# Patient Record
Sex: Female | Born: 1970 | Race: Black or African American | Hispanic: No | Marital: Married | Smoking: Never smoker
Health system: Southern US, Community
[De-identification: ages and names within clinical notes are randomized; demographics above are authoritative.]

## PROBLEM LIST (undated history)

## (undated) DIAGNOSIS — Z8249 Family history of ischemic heart disease and other diseases of the circulatory system: Secondary | ICD-10-CM

## (undated) DIAGNOSIS — K219 Gastro-esophageal reflux disease without esophagitis: Secondary | ICD-10-CM

## (undated) DIAGNOSIS — I499 Cardiac arrhythmia, unspecified: Secondary | ICD-10-CM

## (undated) DIAGNOSIS — N90810 Female genital mutilation status, unspecified: Secondary | ICD-10-CM

## (undated) HISTORY — DX: Family history of ischemic heart disease and other diseases of the circulatory system: Z82.49

## (undated) HISTORY — PX: OTHER SURGICAL HISTORY: SHX169

## (undated) HISTORY — DX: Cardiac arrhythmia, unspecified: I49.9

---

## 2002-09-12 HISTORY — PX: TONSILLECTOMY: SUR1361

## 2010-09-12 NOTE — L&D Delivery Note (Signed)
Delivery Note Pt rapidly progressed to C/C/+2 with pressure, had received Stadol x 3 doses.  Pushed x 2-3 ctx to deliver.  Narcan available in room.  At 2:11 PM a viable female was delivered via Vaginal, Spontaneous Delivery (Presentation: Left Occiput Anterior).  APGAR: 9, 9; weight 7 lb 2.5 oz (3246 g).   Placenta status: Intact, Spontaneous.  Cord: 3 vessels with the following complications: None.    Anesthesia:  IV pain meds Episiotomy: None Lacerations: 2nd degree Suture Repair: 2.0 vicryl to reinforce sphincter and 3-0 vicryl rapide for 2nd degree Est. Blood Loss (mL): 300  Mom to postpartum.  Baby to nursery-stable.  Br/Bo, AB +  [name],Kirsten Torres 05/28/2011, 2:49 PM

## 2010-10-22 ENCOUNTER — Other Ambulatory Visit: Payer: Self-pay | Admitting: Obstetrics and Gynecology

## 2010-10-22 ENCOUNTER — Inpatient Hospital Stay (HOSPITAL_COMMUNITY)
Admission: AD | Admit: 2010-10-22 | Discharge: 2010-10-23 | Disposition: A | Discharge status: Home / Self Care | Payer: Self-pay | Source: Ambulatory Visit | Attending: Obstetrics and Gynecology | Admitting: Obstetrics and Gynecology

## 2010-10-22 ENCOUNTER — Inpatient Hospital Stay (HOSPITAL_COMMUNITY): Payer: Self-pay

## 2010-10-22 ENCOUNTER — Encounter (HOSPITAL_COMMUNITY): Payer: Self-pay

## 2010-10-22 DIAGNOSIS — O210 Mild hyperemesis gravidarum: Secondary | ICD-10-CM | POA: Insufficient documentation

## 2010-10-22 DIAGNOSIS — B373 Candidiasis of vulva and vagina: Secondary | ICD-10-CM

## 2010-10-22 DIAGNOSIS — O2390 Unspecified genitourinary tract infection in pregnancy, unspecified trimester: Secondary | ICD-10-CM

## 2010-10-22 DIAGNOSIS — B3731 Acute candidiasis of vulva and vagina: Secondary | ICD-10-CM | POA: Insufficient documentation

## 2010-10-22 LAB — ABO/RH: RH Type: POSITIVE

## 2010-10-22 LAB — HIV ANTIBODY (ROUTINE TESTING W REFLEX): HIV: NONREACTIVE

## 2010-10-22 LAB — RPR: RPR: NONREACTIVE

## 2010-10-22 LAB — POCT PREGNANCY, URINE: Preg Test, Ur: POSITIVE

## 2010-11-10 LAB — URINALYSIS, ROUTINE W REFLEX MICROSCOPIC
Hgb urine dipstick: NEGATIVE
Specific Gravity, Urine: >1.03 — ABNORMAL HIGH (ref 1.005–1.030)
Urobilinogen, UA: 0.2 mg/dL (ref 0.0–1.0)
pH: 5.5 (ref 5.0–8.0)

## 2010-11-10 LAB — GC/CHLAMYDIA PROBE AMP, GENITAL
Chlamydia, DNA Probe: NEGATIVE
GC Probe Amp, Genital: NEGATIVE

## 2010-11-10 LAB — CBC
HCT: 36.3 % (ref 36.0–46.0)
Hemoglobin: 11.9 g/dL — ABNORMAL LOW (ref 12.0–15.0)
MCV: 89 fL (ref 78.0–100.0)
Platelets: 180 10*3/uL (ref 150–400)
RBC: 4.08 MIL/uL (ref 3.87–5.11)
WBC: 7.5 10*3/uL (ref 4.0–10.5)

## 2010-11-10 LAB — WET PREP, GENITAL

## 2011-05-04 LAB — STREP B DNA PROBE: GBS: NEGATIVE

## 2011-05-28 ENCOUNTER — Encounter (HOSPITAL_COMMUNITY): Payer: Self-pay | Admitting: *Deleted

## 2011-05-28 ENCOUNTER — Inpatient Hospital Stay (HOSPITAL_COMMUNITY)
Admission: AD | Admit: 2011-05-28 | Discharge: 2011-05-30 | DRG: 775 | Disposition: A | Discharge status: Home / Self Care | Payer: 59 | Source: Ambulatory Visit | Attending: Obstetrics and Gynecology | Admitting: Obstetrics and Gynecology

## 2011-05-28 DIAGNOSIS — Z3490 Encounter for supervision of normal pregnancy, unspecified, unspecified trimester: Secondary | ICD-10-CM

## 2011-05-28 DIAGNOSIS — O09529 Supervision of elderly multigravida, unspecified trimester: Secondary | ICD-10-CM | POA: Diagnosis present

## 2011-05-28 HISTORY — DX: Gastro-esophageal reflux disease without esophagitis: K21.9

## 2011-05-28 HISTORY — DX: Female genital mutilation status, unspecified: N90.810

## 2011-05-28 LAB — CBC
MCV: 80.5 fL (ref 78.0–100.0)
Platelets: 224 10*3/uL (ref 150–400)
RDW: 14.7 % (ref 11.5–15.5)
WBC: 8.4 10*3/uL (ref 4.0–10.5)

## 2011-05-28 MED ORDER — SODIUM CHLORIDE 0.9 % IJ SOLN: 3.0000 mL | INTRAMUSCULAR | Status: DC | PRN

## 2011-05-28 MED ORDER — FLEET ENEMA 7-19 GM/118ML RE ENEM: 1.0000 | ENEMA | RECTAL | Status: DC | PRN

## 2011-05-28 MED ORDER — SIMETHICONE 80 MG PO CHEW: 80.0000 mg | CHEWABLE_TABLET | ORAL | Status: DC | PRN

## 2011-05-28 MED ORDER — OXYTOCIN 20 UNITS IN LACTATED RINGERS INFUSION - SIMPLE: 125.0000 mL/h | INTRAVENOUS | Status: DC | PRN

## 2011-05-28 MED ORDER — CALCIUM CARBONATE ANTACID 500 MG PO CHEW
1.0000 | CHEWABLE_TABLET | Freq: Every day | ORAL | Status: DC
  Administered 2011-05-29: 200 mg via ORAL
  Filled 2011-05-28: qty 1

## 2011-05-28 MED ORDER — NALOXONE HCL 0.4 MG/ML IJ SOLN
INTRAMUSCULAR | Status: AC
  Filled 2011-05-28: qty 1

## 2011-05-28 MED ORDER — OXYTOCIN BOLUS FROM INFUSION
500.0000 mL | Freq: Once | INTRAVENOUS | Status: DC
  Filled 2011-05-28: qty 500

## 2011-05-28 MED ORDER — TETANUS-DIPHTH-ACELL PERTUSSIS 5-2.5-18.5 LF-MCG/0.5 IM SUSP
0.5000 mL | Freq: Once | INTRAMUSCULAR | Status: AC
  Administered 2011-05-29: 0.5 mL via INTRAMUSCULAR
  Filled 2011-05-28: qty 0.5

## 2011-05-28 MED ORDER — ONDANSETRON HCL 4 MG PO TABS: 4.0000 mg | ORAL_TABLET | ORAL | Status: DC | PRN

## 2011-05-28 MED ORDER — BUTORPHANOL TARTRATE 2 MG/ML IJ SOLN: 2.0000 mg | Freq: Once | INTRAMUSCULAR | Status: DC

## 2011-05-28 MED ORDER — BUTORPHANOL TARTRATE 2 MG/ML IJ SOLN
1.0000 mg | INTRAMUSCULAR | Status: DC | PRN
  Administered 2011-05-28 (×2): 1 mg via INTRAVENOUS
  Filled 2011-05-28 (×3): qty 1

## 2011-05-28 MED ORDER — DIBUCAINE 1 % RE OINT: 1.0000 "application " | TOPICAL_OINTMENT | RECTAL | Status: DC | PRN

## 2011-05-28 MED ORDER — IBUPROFEN 600 MG PO TABS
600.0000 mg | ORAL_TABLET | Freq: Four times a day (QID) | ORAL | Status: DC | PRN
  Administered 2011-05-28: 600 mg via ORAL
  Filled 2011-05-28: qty 1

## 2011-05-28 MED ORDER — WITCH HAZEL-GLYCERIN EX PADS
1.0000 "application " | MEDICATED_PAD | CUTANEOUS | Status: DC | PRN
  Administered 2011-05-28: 1 via TOPICAL

## 2011-05-28 MED ORDER — TERBUTALINE SULFATE 1 MG/ML IJ SOLN: 0.2500 mg | Freq: Once | INTRAMUSCULAR | Status: DC | PRN

## 2011-05-28 MED ORDER — CITRIC ACID-SODIUM CITRATE 334-500 MG/5ML PO SOLN: 30.0000 mL | ORAL | Status: DC | PRN

## 2011-05-28 MED ORDER — ACETAMINOPHEN 325 MG PO TABS: 650.0000 mg | ORAL_TABLET | ORAL | Status: DC | PRN

## 2011-05-28 MED ORDER — ONDANSETRON HCL 4 MG/2ML IJ SOLN: 4.0000 mg | INTRAMUSCULAR | Status: DC | PRN

## 2011-05-28 MED ORDER — LACTATED RINGERS IV SOLN: 500.0000 mL | INTRAVENOUS | Status: DC | PRN

## 2011-05-28 MED ORDER — ONDANSETRON HCL 4 MG/2ML IJ SOLN: 4.0000 mg | Freq: Four times a day (QID) | INTRAMUSCULAR | Status: DC | PRN

## 2011-05-28 MED ORDER — PRENATAL PLUS 27-1 MG PO TABS
1.0000 | ORAL_TABLET | Freq: Every day | ORAL | Status: DC
  Administered 2011-05-29 – 2011-05-30 (×2): 1 via ORAL
  Filled 2011-05-28 (×2): qty 1

## 2011-05-28 MED ORDER — BENZOCAINE-MENTHOL 20-0.5 % EX AERO
1.0000 "application " | INHALATION_SPRAY | CUTANEOUS | Status: DC | PRN
  Administered 2011-05-28: 1 via TOPICAL

## 2011-05-28 MED ORDER — LIDOCAINE HCL (PF) 1 % IJ SOLN
30.0000 mL | INTRAMUSCULAR | Status: AC | PRN
  Administered 2011-05-28: 30 mL via SUBCUTANEOUS
  Filled 2011-05-28: qty 30

## 2011-05-28 MED ORDER — SODIUM CHLORIDE 0.9 % IJ SOLN: 3.0000 mL | Freq: Two times a day (BID) | INTRAMUSCULAR | Status: DC

## 2011-05-28 MED ORDER — OXYTOCIN 20 UNITS IN LACTATED RINGERS INFUSION - SIMPLE
125.0000 mL/h | Freq: Once | INTRAVENOUS | Status: AC
  Administered 2011-05-28: 125 mL/h via INTRAVENOUS
  Filled 2011-05-28: qty 1000

## 2011-05-28 MED ORDER — ZOLPIDEM TARTRATE 5 MG PO TABS: 5.0000 mg | ORAL_TABLET | Freq: Every evening | ORAL | Status: DC | PRN

## 2011-05-28 MED ORDER — BUTORPHANOL TARTRATE 2 MG/ML IJ SOLN
2.0000 mg | Freq: Once | INTRAMUSCULAR | Status: AC
  Administered 2011-05-28: 2 mg via INTRAVENOUS

## 2011-05-28 MED ORDER — LACTATED RINGERS IV SOLN
INTRAVENOUS | Status: DC
  Administered 2011-05-28: 09:00:00 via INTRAVENOUS

## 2011-05-28 MED ORDER — LANOLIN HYDROUS EX OINT: TOPICAL_OINTMENT | CUTANEOUS | Status: DC | PRN

## 2011-05-28 MED ORDER — DIPHENHYDRAMINE HCL 25 MG PO CAPS: 25.0000 mg | ORAL_CAPSULE | Freq: Four times a day (QID) | ORAL | Status: DC | PRN

## 2011-05-28 MED ORDER — OXYCODONE-ACETAMINOPHEN 5-325 MG PO TABS
1.0000 | ORAL_TABLET | ORAL | Status: DC | PRN
  Administered 2011-05-29: 1 via ORAL
  Filled 2011-05-28: qty 1

## 2011-05-28 MED ORDER — OXYTOCIN 20 UNITS IN LACTATED RINGERS INFUSION - SIMPLE
1.0000 m[IU]/min | INTRAVENOUS | Status: DC
  Administered 2011-05-28: 2 m[IU]/min via INTRAVENOUS

## 2011-05-28 MED ORDER — IBUPROFEN 600 MG PO TABS
600.0000 mg | ORAL_TABLET | Freq: Four times a day (QID) | ORAL | Status: DC
  Administered 2011-05-28 – 2011-05-30 (×7): 600 mg via ORAL
  Filled 2011-05-28 (×7): qty 1

## 2011-05-28 MED ORDER — SODIUM CHLORIDE 0.9 % IV SOLN: 250.0000 mL | INTRAVENOUS | Status: DC

## 2011-05-28 MED ORDER — OXYCODONE-ACETAMINOPHEN 5-325 MG PO TABS: 2.0000 | ORAL_TABLET | ORAL | Status: DC | PRN

## 2011-05-28 MED ORDER — BENZOCAINE-MENTHOL 20-0.5 % EX AERO
INHALATION_SPRAY | CUTANEOUS | Status: AC
  Administered 2011-05-28: 1 via TOPICAL
  Filled 2011-05-28: qty 56

## 2011-05-28 MED ORDER — LACTATED RINGERS IV SOLN: INTRAVENOUS | Status: DC

## 2011-05-28 MED ORDER — SENNOSIDES-DOCUSATE SODIUM 8.6-50 MG PO TABS
2.0000 | ORAL_TABLET | Freq: Every day | ORAL | Status: DC
  Administered 2011-05-28: 2 via ORAL

## 2011-05-28 NOTE — Progress Notes (Signed)
  05/28/11  FHTs - 130's, accelerations toco Q 3- 6 min AROM for clear fluid, w/o diff/comp SVE 6/90/0  Jeanella Cara, MD

## 2011-05-28 NOTE — H&P (Signed)
Kirsten Torres is a 40 y.o. female G2P1001 at 39+ presenting for onset of labor.  Contractions since about 3am, increasing in intensity and frwuency.  Uncomplicated PNC, limited by later entry 23wk as well as language barrier.  She has h/o femal genital alteration. Maternal Medical History:  Reason for admission: Reason for admission: contractions.  Contractions: Onset was 6-12 hours ago.    Fetal activity: Perceived fetal activity is normal.      OB History    Grav Para Term Preterm Abortions TAB SAB Ect Mult Living   2 1 1  0 0 0 0 0 0 1    G1 SVD 8#, female SVD; G2 present;   Past Medical History  Diagnosis Date  . No pertinent past medical history   GERD  Past Surgical History  Procedure Date  . 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 She does not have any smokeless tobacco history on file. She reports that she does not drink alcohol or use illicit drugs.; married homemaker Meds PNV All NKDA  Review of Systems  Constitutional: Negative.   HENT: Negative.   Eyes: Negative.   Respiratory: Negative.   Cardiovascular: Negative.   Gastrointestinal: Negative.   Genitourinary: Negative.   Musculoskeletal: Negative.   Skin: Negative.   Psychiatric/Behavioral: Negative.     Dilation: 4.5 Effacement (%): 80 Station: 0 Exam by:: K.Wilson,RN Blood pressure 115/64, pulse 75, temperature 98.5 F (36.9 C), temperature source Oral, resp. rate 20, height 5' 4.57" (1.64 m), weight 86.183 kg (190 lb), last menstrual period 08/24/2010. Exam Physical Exam  Constitutional: She is oriented to person, place, and time. She appears well-developed and well-nourished.  HENT:  Head: Normocephalic and atraumatic.  Neck: Normal range of motion. Neck supple. No thyromegaly present.  Cardiovascular: Normal rate and regular rhythm.   Respiratory: Effort normal and  breath sounds normal.  GI: Soft. Bowel sounds are normal.  Musculoskeletal: Normal range of motion.  Neurological: She is alert and oriented to person, place, and time.  Psychiatric: She has a normal mood and affect.    Prenatal labs: ABO, Rh: --/--/AB POS, AB POS (02/10 2120) Antibody:  screen neg Rubella:  immune RPR: Nonreactive (02/10 0000)  HBsAg: Negative (02/10 0000)  HIV: Non-reactive (02/10 0000)  GBS:   neg Pap WNL, UrCx neg, Plt 221 K, Sickle WNL, GC neg, Chl neg, CF neg, glucola 139, 3hr GTT neg  8wk [place] dates preg [place] at 19wk, cwd, ant wall fibroid, post plac, nl anat, female  Assessment/Plan: 39yo G2P1001 at 39+ with onset of labor IV pain meds, epidural PRN AROM and pitocin to augment prn Expect SVD   [name],Ellaina Schuler 05/28/2011, 10:51 AM

## 2011-05-28 NOTE — Progress Notes (Signed)
Contraction since 0300 this morning, G2P1 term, patient very uncomfortable taken directly to room 6 in MAU

## 2011-05-29 LAB — CBC
MCHC: 31.4 g/dL (ref 30.0–36.0)
RDW: 14.7 % (ref 11.5–15.5)

## 2011-05-29 NOTE — Progress Notes (Signed)
Post Partum Day 1 Subjective: no complaints and nl lochia, pain controlled  Objective: Blood pressure 111/76, pulse 81, temperature 98.2 F (36.8 C), temperature source Oral, resp. rate 18, height 5' 4.57" (1.64 m), weight 86.183 kg (190 lb), last menstrual period 08/24/2010, unknown if currently breastfeeding.  Physical Exam:  General: alert and no distress Lochia: appropriate Uterine Fundus: firm    Basename 05/29/11 0601 05/28/11 0925  HGB 9.0* 10.9*  HCT 28.7* 35.1*    Assessment/Plan: Plan for discharge tomorrow   LOS: 1 day   [name],Dilia Alemany 05/29/2011, 9:47 AM

## 2011-05-30 MED ORDER — PRENATAL PLUS 27-1 MG PO TABS: 1.0000 | ORAL_TABLET | Freq: Every day | ORAL | Status: DC

## 2011-05-30 MED ORDER — OXYCODONE-ACETAMINOPHEN 5-325 MG PO TABS: 1.0000 | ORAL_TABLET | ORAL | Status: AC | PRN

## 2011-05-30 MED ORDER — IBUPROFEN 800 MG PO TABS: 800.0000 mg | ORAL_TABLET | Freq: Three times a day (TID) | ORAL | Status: AC | PRN

## 2011-05-30 NOTE — Progress Notes (Signed)
Spoke to patient with help of [employer] [phone].  Asked about medications, pain, bleeding, and breastfeeding.  Pt reported that breastfeeding is going well and that she has no concerns about her physical recovery.  Explained discharge procedures.  Pt's husband gone at time of call, but will be back shortly with carseat for baby.

## 2011-05-30 NOTE — Discharge Summary (Signed)
Obstetric Discharge Summary Reason for Admission: onset of labor Prenatal Procedures: none Intrapartum Procedures: spontaneous vaginal delivery Postpartum Procedures: none Complications-Operative and Postpartum: 2nd degree perineal laceration Hemoglobin  Date Value Range Status  05/29/2011 9.0* 12.0-15.0 (g/dL) Final     DELTA CHECK NOTED     REPEATED TO VERIFY     HCT  Date Value Range Status  05/29/2011 28.7* 36.0-46.0 (%) Final    Discharge Diagnoses: Term Pregnancy-delivered  Discharge Information: Date: 05/30/2011 Activity: pelvic rest Diet: routine Medications: PNV, Ibuprophen and Percocet Condition: stable Instructions: refer to practice specific booklet Discharge to: home Follow-up Information    Follow up with [name],Brandonlee Navis, MD. Make an appointment in 6 weeks.   Contact information:   [address] [facility] [address] [place] [address] [phone]          Newborn Data: Live born female  Birth Weight: 7 lb 2.5 oz (3246 g) APGAR: 9, 9  Home with mother.  [name],Deiona Hooper 05/30/2011, 9:02 AM

## 2011-05-30 NOTE — Progress Notes (Signed)
Post Partum Day 2 Subjective: no complaints, tolerating PO and nl lochia, pain controlled  Objective: Blood pressure 110/74, pulse 71, temperature 98.3 F (36.8 C), temperature source Oral, resp. rate 18, height 5' 4.57" (1.64 m), weight 86.183 kg (190 lb), last menstrual period 08/24/2010, unknown if currently breastfeeding.  Physical Exam:  General: alert and no distress Lochia: appropriate Uterine Fundus: firm    Basename 05/29/11 0601 05/28/11 0925  HGB 9.0* 10.9*  HCT 28.7* 35.1*    Assessment/Plan: Discharge home and Breastfeeding/Bottlefeeding, D/C with Motrin, Percocet, PNV.  F/u 6 weeks   LOS: 2 days   [name],Tahisha Hakim 05/30/2011, 8:52 AM

## 2011-06-08 ENCOUNTER — Inpatient Hospital Stay (HOSPITAL_COMMUNITY): Admission: RE | Admit: 2011-06-08 | Payer: 59 | Source: Ambulatory Visit

## 2012-11-15 ENCOUNTER — Ambulatory Visit: Payer: 59 | Admitting: Family Medicine

## 2012-11-15 ENCOUNTER — Ambulatory Visit: Payer: 59

## 2012-11-15 VITALS — BP 110/62 | HR 88 | Temp 97.7°F | Resp 18 | Ht 65.75 in | Wt 172.0 lb

## 2012-11-15 DIAGNOSIS — K5900 Constipation, unspecified: Secondary | ICD-10-CM

## 2012-11-15 DIAGNOSIS — R109 Unspecified abdominal pain: Secondary | ICD-10-CM

## 2012-11-15 DIAGNOSIS — M549 Dorsalgia, unspecified: Secondary | ICD-10-CM

## 2012-11-15 LAB — POCT CBC
Granulocyte percent: 47.6 %G (ref 37–80)
Hemoglobin: 12.8 g/dL (ref 12.2–16.2)
MCH, POC: 27.9 pg (ref 27–31.2)
MCV: 90.6 fL (ref 80–97)
MID (cbc): 0.5 (ref 0–0.9)
MPV: 9.5 fL (ref 0–99.8)
POC MID %: 6.9 %M (ref 0–12)
Platelet Count, POC: 259 10*3/uL (ref 142–424)
RBC: 4.58 M/uL (ref 4.04–5.48)
WBC: 6.8 10*3/uL (ref 4.6–10.2)

## 2012-11-15 LAB — POCT URINALYSIS DIPSTICK
Bilirubin, UA: NEGATIVE
Blood, UA: NEGATIVE
Glucose, UA: NEGATIVE
Nitrite, UA: NEGATIVE
Urobilinogen, UA: 0.2

## 2012-11-15 LAB — POCT UA - MICROSCOPIC ONLY: RBC, urine, microscopic: NEGATIVE

## 2012-11-15 LAB — POCT URINE PREGNANCY: Preg Test, Ur: NEGATIVE

## 2012-11-15 NOTE — Patient Instructions (Addendum)
For constipation - Miralax - 1 capful each day until bowel movements are soft. Drink plenty of fluids, fiber in diet.  Constipation, Adult Constipation is when a person has fewer than 3 bowel movements a week; has difficulty having a bowel movement; or has stools that are dry, hard, or larger than normal. As people grow older, constipation is more common. If you try to fix constipation with medicines that make you have a bowel movement (laxatives), the problem may get worse. Long-term laxative use may cause the muscles of the colon to become weak. A low-fiber diet, not taking in enough fluids, and taking certain medicines may make constipation worse. CAUSES   Certain medicines, such as antidepressants, pain medicine, iron supplements, antacids, and water pills.   Certain diseases, such as diabetes, irritable bowel syndrome (IBS), thyroid disease, or depression.   Not drinking enough water.   Not eating enough fiber-rich foods.   Stress or travel.  Lack of physical activity or exercise.  Not going to the restroom when there is the urge to have a bowel movement.  Ignoring the urge to have a bowel movement.  Using laxatives too much. SYMPTOMS   Having fewer than 3 bowel movements a week.   Straining to have a bowel movement.   Having hard, dry, or larger than normal stools.   Feeling full or bloated.   Pain in the lower abdomen.  Not feeling relief after having a bowel movement. DIAGNOSIS  Your caregiver will take a medical history and perform a physical exam. Further testing may be done for severe constipation. Some tests may include:   A barium enema X-ray to examine your rectum, colon, and sometimes, your small intestine.  A sigmoidoscopy to examine your lower colon.  A colonoscopy to examine your entire colon. TREATMENT  Treatment will depend on the severity of your constipation and what is causing it. Some dietary treatments include drinking more fluids and  eating more fiber-rich foods. Lifestyle treatments may include regular exercise. If these diet and lifestyle recommendations do not help, your caregiver may recommend taking over-the-counter laxative medicines to help you have bowel movements. Prescription medicines may be prescribed if over-the-counter medicines do not work.  HOME CARE INSTRUCTIONS   Increase dietary fiber in your diet, such as fruits, vegetables, whole grains, and beans. Limit high-fat and processed sugars in your diet, such as [place] fries, hamburgers, cookies, candies, and soda.   A fiber supplement may be added to your diet if you cannot get enough fiber from foods.   Drink enough fluids to keep your urine clear or pale yellow.   Exercise regularly or as directed by your caregiver.   Go to the restroom when you have the urge to go. Do not hold it.  Only take medicines as directed by your caregiver. Do not take other medicines for constipation without talking to your caregiver first. SEEK IMMEDIATE MEDICAL CARE IF:   You have bright red blood in your stool.   Your constipation lasts for more than 4 days or gets worse.   You have abdominal or rectal pain.   You have thin, pencil-like stools.  You have unexplained weight loss. MAKE SURE YOU:   Understand these instructions.  Will watch your condition.  Will get help right away if you are not doing well or get worse. Document Released: 05/27/2004 Document Revised: 11/21/2011 Document Reviewed: 08/02/2011 [facility] Patient Information 2013 [place], [place].   You can take tylenol for the low back pain - see the  back owner's manual.  If not improving in the next few weeks, or worse sooner - recheck in the office or emergency room if worse.

## 2012-11-15 NOTE — Progress Notes (Signed)
Subjective:    Patient ID: Kirsten Torres, female    DOB: 05-Sep-1971, 42 y.o.   MRN: [id number]  HPI  Kirsten Torres is a 42 y.o. female  Lower back sore, feels constipated on and off since 2012.  Noticed some possible mucus with stool today. Sore in lower back if sitting for a long time.  [place] a few times and fell on the low back area since 2007.  Feeling pain higher in the low back area since 2010 into R buttocks at times. No bowel or bladder incontinence, no saddle anesthesia, no lower extremity weakness. [place] few years ago. Occasional foot pain if standing for a long time.   Constipation - full feeling, no dysuria/urgency/frequency.  BM - 1 each day - hard stool. Tx: tried otc med for 1 week - unknown name - no relief and this was a while ago.  LMP 1 week ago - normal then, but possible  irregular period before that with pain.  Later in history - stated abdominal pain for past 2 days - lower, but no urinary sx's as above. No f/c.   Language barrier - husband translating, but also clarified history a few times.   Review of Systems As above.     Objective:   Physical Exam  Vitals reviewed. Constitutional: She is oriented to person, place, and time. She appears well-developed and well-nourished.  HENT:  Head: Normocephalic and atraumatic.  Eyes: Scleral icterus is present.  Cardiovascular: Normal rate, regular rhythm, normal heart sounds and intact distal pulses.   Pulmonary/Chest: Effort normal and breath sounds normal.  Abdominal: Soft. Normal appearance. She exhibits no distension. There is no tenderness. There is no rebound, no guarding and no CVA tenderness.  Musculoskeletal:       Lumbar back: She exhibits tenderness. She exhibits normal range of motion and no bony tenderness.       Back:  Neurological: She is alert and oriented to person, place, and time.  Reflex Scores:      Patellar reflexes are 2+ on the  right side and 2+ on the left side.      Achilles reflexes are 2+ on the right side and 2+ on the left side. Able to heel and toe walk without difficulty.   Skin: Skin is warm.  Psychiatric: She has a normal mood and affect. Her behavior is normal.     Results for orders placed in visit on 11/15/12  POCT URINE PREGNANCY      Result Value Range   Preg Test, Ur Negative    POCT CBC      Result Value Range   WBC 6.8  4.6 - 10.2 K/uL   Lymph, poc 3.1  0.6 - 3.4   POC LYMPH PERCENT 45.5  10 - 50 %L   MID (cbc) 0.5  0 - 0.9   POC MID % 6.9  0 - 12 %M   POC Granulocyte 3.2  2 - 6.9   Granulocyte percent 47.6  37 - 80 %G   RBC 4.58  4.04 - 5.48 M/uL   Hemoglobin 12.8  12.2 - 16.2 g/dL   HCT, POC 78.2  95.6 - 47.9 %   MCV 90.6  80 - 97 fL   MCH, POC 27.9  27 - 31.2 pg   MCHC 30.8 (*) 31.8 - 35.4 g/dL   RDW, POC 21.3     Platelet Count, POC 259  142 -  424 K/uL   MPV 9.5  0 - 99.8 fL  POCT URINALYSIS DIPSTICK      Result Value Range   Color, UA yellow     Clarity, UA clear     Glucose, UA neg     Bilirubin, UA neg     Ketones, UA trace     Spec Grav, UA >=1.030     Blood, UA neg     pH, UA 5.5     Protein, UA neg     Urobilinogen, UA 0.2     Nitrite, UA neg     Leukocytes, UA Negative    POCT UA - MICROSCOPIC ONLY      Result Value Range   WBC, Ur, HPF, POC 1-2     RBC, urine, microscopic neg     Bacteria, U Microscopic trace     Mucus, UA neg     Epithelial cells, urine per micros 3-8     Crystals, Ur, HPF, POC neg     Casts, Ur, LPF, POC neg     Yeast, UA neg     [facility] reading (PRIMARY) by  Dr. Neva Seat:  LS spine 2 view: NAD Abd 2 view. Increased stool, nonspecific bowel gas findings.       Assessment & Plan:  Kirsten Torres is a 42 y.o. female Abdominal  pain, other specified site - Plan: POCT urine pregnancy, POCT CBC, POCT urinalysis dipstick, POCT UA - Microscopic Only, DG Abd 2 Views, DG Lumbar Spine 2-3 Views, Urine culture  Back pain - Plan: POCT urine  pregnancy, POCT CBC, POCT urinalysis dipstick, POCT UA - Microscopic Only, DG Abd 2 Views, DG Lumbar Spine 2-3 Views.  Suspected recurrent msk/mechanical LBP with picking up young child, and by report falls in past - may have component of coccydynia.  Still breastfeeding, so can start with tylenol otc prn, back care manual, heat/ice prn and recheck next few weeks if not improving.   Constipation - likely cause of abd pain above - reassuring u/a and cbc, increased stool on xr - discussed miralax, fluids, fiber, and recheck if not improving in next week.  Also discussed recheck with rectal exam if any blood in stool, or s/sx's of fissure or hemorrhoid.  Urine culture obtained, but reassuring u/a in office. rtc precautions.   Language barrier - husband translating - understanding expressed.  Patient Instructions  For constipation - Miralax - 1 capful each day until bowel movements are soft. Drink plenty of fluids, fiber in diet.  Constipation, Adult Constipation is when a person has fewer than 3 bowel movements a week; has difficulty having a bowel movement; or has stools that are dry, hard, or larger than normal. As people grow older, constipation is more common. If you try to fix constipation with medicines that make you have a bowel movement (laxatives), the problem may get worse. Long-term laxative use may cause the muscles of the colon to become weak. A low-fiber diet, not taking in enough fluids, and taking certain medicines may make constipation worse. CAUSES   Certain medicines, such as antidepressants, pain medicine, iron supplements, antacids, and water pills.   Certain diseases, such as diabetes, irritable bowel syndrome (IBS), thyroid disease, or depression.   Not drinking enough water.   Not eating enough fiber-rich foods.   Stress or travel.  Lack of physical activity or exercise.  Not going to the restroom when there is the urge to have a bowel movement.  Ignoring the urge  to have a bowel movement.  Using laxatives too much. SYMPTOMS   Having fewer than 3 bowel movements a week.   Straining to have a bowel movement.   Having hard, dry, or larger than normal stools.   Feeling full or bloated.   Pain in the lower abdomen.  Not feeling relief after having a bowel movement. DIAGNOSIS  Your caregiver will take a medical history and perform a physical exam. Further testing may be done for severe constipation. Some tests may include:   A barium enema X-ray to examine your rectum, colon, and sometimes, your small intestine.  A sigmoidoscopy to examine your lower colon.  A colonoscopy to examine your entire colon. TREATMENT  Treatment will depend on the severity of your constipation and what is causing it. Some dietary treatments include drinking more fluids and eating more fiber-rich foods. Lifestyle treatments may include regular exercise. If these diet and lifestyle recommendations do not help, your caregiver may recommend taking over-the-counter laxative medicines to help you have bowel movements. Prescription medicines may be prescribed if over-the-counter medicines do not work.  HOME CARE INSTRUCTIONS   Increase dietary fiber in your diet, such as fruits, vegetables, whole grains, and beans. Limit high-fat and processed sugars in your diet, such as [place] fries, hamburgers, cookies, candies, and soda.   A fiber supplement may be added to your diet if you cannot get enough fiber from foods.   Drink enough fluids to keep your urine clear or pale yellow.   Exercise regularly or as directed by your caregiver.   Go to the restroom when you have the urge to go. Do not hold it.  Only take medicines as directed by your caregiver. Do not take other medicines for constipation without talking to your caregiver first. SEEK IMMEDIATE MEDICAL CARE IF:   You have bright red blood in your stool.   Your constipation lasts for more than 4 days or gets  worse.   You have abdominal or rectal pain.   You have thin, pencil-like stools.  You have unexplained weight loss. MAKE SURE YOU:   Understand these instructions.  Will watch your condition.  Will get help right away if you are not doing well or get worse. Document Released: 05/27/2004 Document Revised: 11/21/2011 Document Reviewed: 08/02/2011 [facility] Patient Information 2013 [place], [place].   You can take tylenol for the low back pain - see the back owner's manual.  If not improving in the next few weeks, or worse sooner - recheck in the office or emergency room if worse.

## 2012-11-19 LAB — URINE CULTURE
Colony Count: NO GROWTH
Organism ID, Bacteria: NO GROWTH

## 2012-12-31 ENCOUNTER — Emergency Department (HOSPITAL_COMMUNITY)
Admission: EM | Admit: 2012-12-31 | Discharge: 2012-12-31 | Disposition: A | Discharge status: Home / Self Care | Payer: 59 | Attending: Emergency Medicine | Admitting: Emergency Medicine

## 2012-12-31 ENCOUNTER — Encounter (HOSPITAL_COMMUNITY): Payer: Self-pay | Admitting: Emergency Medicine

## 2012-12-31 ENCOUNTER — Emergency Department (HOSPITAL_COMMUNITY): Payer: 59

## 2012-12-31 ENCOUNTER — Encounter: Payer: Self-pay | Admitting: Emergency Medicine

## 2012-12-31 DIAGNOSIS — R10819 Abdominal tenderness, unspecified site: Secondary | ICD-10-CM | POA: Insufficient documentation

## 2012-12-31 DIAGNOSIS — M549 Dorsalgia, unspecified: Secondary | ICD-10-CM | POA: Insufficient documentation

## 2012-12-31 DIAGNOSIS — Z8711 Personal history of peptic ulcer disease: Secondary | ICD-10-CM | POA: Insufficient documentation

## 2012-12-31 DIAGNOSIS — Z79899 Other long term (current) drug therapy: Secondary | ICD-10-CM | POA: Insufficient documentation

## 2012-12-31 DIAGNOSIS — Z8742 Personal history of other diseases of the female genital tract: Secondary | ICD-10-CM | POA: Insufficient documentation

## 2012-12-31 DIAGNOSIS — R112 Nausea with vomiting, unspecified: Secondary | ICD-10-CM | POA: Insufficient documentation

## 2012-12-31 LAB — CBC WITH DIFFERENTIAL/PLATELET
Basophils Relative: 0 % (ref 0–1)
HCT: 37.2 % (ref 36.0–46.0)
Hemoglobin: 12.3 g/dL (ref 12.0–15.0)
Lymphocytes Relative: 24 % (ref 12–46)
Lymphs Abs: 1.8 10*3/uL (ref 0.7–4.0)
Monocytes Absolute: 0.4 10*3/uL (ref 0.1–1.0)
Monocytes Relative: 5 % (ref 3–12)
Neutro Abs: 5.2 10*3/uL (ref 1.7–7.7)
Neutrophils Relative %: 70 % (ref 43–77)
RBC: 4.24 MIL/uL (ref 3.87–5.11)
WBC: 7.5 10*3/uL (ref 4.0–10.5)

## 2012-12-31 LAB — COMPREHENSIVE METABOLIC PANEL
ALT: 6 U/L (ref 0–35)
Albumin: 3.5 g/dL (ref 3.5–5.2)
Alkaline Phosphatase: 57 U/L (ref 39–117)
Chloride: 102 mEq/L (ref 96–112)
Glucose, Bld: 111 mg/dL — ABNORMAL HIGH (ref 70–99)
Potassium: 4.2 mEq/L (ref 3.5–5.1)
Sodium: 135 mEq/L (ref 135–145)
Total Protein: 6.7 g/dL (ref 6.0–8.3)

## 2012-12-31 LAB — URINALYSIS, ROUTINE W REFLEX MICROSCOPIC
Glucose, UA: NEGATIVE mg/dL
Hgb urine dipstick: NEGATIVE
Specific Gravity, Urine: 1.026 (ref 1.005–1.030)
Urobilinogen, UA: 0.2 mg/dL (ref 0.0–1.0)
pH: 7.5 (ref 5.0–8.0)

## 2012-12-31 LAB — POCT I-STAT, CHEM 8
BUN: 8 mg/dL (ref 6–23)
Hemoglobin: 13.3 g/dL (ref 12.0–15.0)
Potassium: 3.6 mEq/L (ref 3.5–5.1)
Sodium: 140 mEq/L (ref 135–145)
TCO2: 24 mmol/L (ref 0–100)

## 2012-12-31 LAB — PREGNANCY, URINE: Preg Test, Ur: NEGATIVE

## 2012-12-31 MED ORDER — IOHEXOL 300 MG/ML  SOLN
100.0000 mL | Freq: Once | INTRAMUSCULAR | Status: AC | PRN
  Administered 2012-12-31: 80 mL via INTRAVENOUS

## 2012-12-31 MED ORDER — SODIUM CHLORIDE 0.9 % IV BOLUS (SEPSIS)
2000.0000 mL | Freq: Once | INTRAVENOUS | Status: AC
  Administered 2012-12-31: 2000 mL via INTRAVENOUS

## 2012-12-31 MED ORDER — ONDANSETRON HCL 4 MG/2ML IJ SOLN
4.0000 mg | Freq: Once | INTRAMUSCULAR | Status: AC
  Administered 2012-12-31: 4 mg via INTRAVENOUS
  Filled 2012-12-31: qty 2

## 2012-12-31 MED ORDER — HYDROMORPHONE HCL PF 1 MG/ML IJ SOLN
1.0000 mg | Freq: Once | INTRAMUSCULAR | Status: AC
  Administered 2012-12-31: 1 mg via INTRAMUSCULAR
  Filled 2012-12-31: qty 1

## 2012-12-31 MED ORDER — IOHEXOL 300 MG/ML  SOLN
50.0000 mL | Freq: Once | INTRAMUSCULAR | Status: AC | PRN
  Administered 2012-12-31: 50 mL via ORAL

## 2012-12-31 MED ORDER — PROMETHAZINE HCL 25 MG/ML IJ SOLN
12.5000 mg | Freq: Once | INTRAMUSCULAR | Status: AC
  Administered 2012-12-31: 12.5 mg via INTRAVENOUS
  Filled 2012-12-31: qty 1

## 2012-12-31 MED ORDER — PROMETHAZINE HCL 25 MG PO TABS: 25.0000 mg | ORAL_TABLET | Freq: Three times a day (TID) | ORAL | Status: DC | PRN

## 2012-12-31 NOTE — ED Notes (Signed)
Patient transported to CT 

## 2012-12-31 NOTE — ED Provider Notes (Signed)
History     CSN: [id number]  Arrival date & time 12/31/12  1225   First MD Initiated Contact with Patient 12/31/12 1323      Chief Complaint  Patient presents with  . Abdominal Pain  . Nausea  . Emesis    (Consider location/radiation/quality/duration/timing/severity/associated sxs/prior treatment) HPI Patient presents to the emergency department with nausea, vomiting, and abdominal pain, that began this morning.  Patient, states, that the symptoms came on suddenly around 10 AM.  Patient denies chest pain, shortness of breath, diarrhea, fever, cough, sore throat, dizziness, syncope, hemoptysis, bloody stools, or visual changes.  Patient, states, that she also has some back pain, as well.  Patient did not take any medications prior to arrival.  Patient, states, that palpation makes the pain, worse. Past Medical History  Diagnosis Date  . No pertinent past medical history   . GERD (gastroesophageal reflux disease)   . Genital mutilation, female   . SVD (spontaneous vaginal delivery) 05/28/2011    Past Surgical History  Procedure Laterality Date  . Tongue surgery  2004    gland removed under tongue    No family history on file.  History  Substance Use Topics  . Smoking status: Never Smoker   . Smokeless tobacco: Not on file  . Alcohol Use: No    OB History   Grav Para Term Preterm Abortions TAB SAB Ect Mult Living   2 2 2  0 0 0 0 0 0 2      Review of Systems All other systems negative except as documented in the HPI. All pertinent positives and negatives as reviewed in the HPI. Allergies  Review of patient's allergies indicates no known allergies.  Home Medications   Current Outpatient Rx  Name  Route  Sig  Dispense  Refill  . Biotin 1000 MCG tablet   Oral   Take 1,000 mcg by mouth daily.         . norethindrone (CAMILA) 0.35 MG tablet   Oral   Take 1 tablet by mouth daily.           BP 103/69  Pulse 61  Temp(Src) 97.4 F (36.3 C) (Oral)  Resp 16   SpO2 98%  LMP 12/18/2012  Physical Exam  Nursing note and vitals reviewed. Constitutional: She is oriented to person, place, and time. She appears well-developed and well-nourished. No distress.  HENT:  Head: Normocephalic and atraumatic.  Mouth/Throat: Oropharynx is clear and moist. No oropharyngeal exudate.  Eyes: Pupils are equal, round, and reactive to light.  Neck: Normal range of motion. Neck supple.  Cardiovascular: Normal rate, regular rhythm and normal heart sounds.  Exam reveals no gallop and no friction rub.   No murmur heard. Pulmonary/Chest: Effort normal and breath sounds normal. No respiratory distress. She has no wheezes.  Abdominal: Soft. Normal appearance and bowel sounds are normal. She exhibits distension. There is tenderness. There is no rebound, no guarding and no CVA tenderness. No hernia.    Neurological: She is alert and oriented to person, place, and time.  Skin: Skin is warm and dry. No rash noted.    ED Course  Procedures (including critical care time)  Labs Reviewed  COMPREHENSIVE METABOLIC PANEL - Abnormal; Notable for the following:    Glucose, Bld 111 (*)    Total Bilirubin 0.2 (*)    All other components within normal limits  POCT I-STAT, CHEM 8 - Abnormal; Notable for the following:    Glucose, Bld 129 (*)  All other components within normal limits  CBC WITH DIFFERENTIAL  URINALYSIS, ROUTINE W REFLEX MICROSCOPIC  PREGNANCY, URINE  LIPASE, BLOOD   Ct Abdomen Pelvis W Contrast  12/31/2012  *RADIOLOGY REPORT*  Clinical Data: Abdominal pain.  Nausea and vomiting.  CT ABDOMEN AND PELVIS WITH CONTRAST  Technique:  Multidetector CT imaging of the abdomen and pelvis was performed following the standard protocol during bolus administration of intravenous contrast.  Contrast: 50mL OMNIPAQUE IOHEXOL 300 MG/ML  SOLN, 80mL OMNIPAQUE IOHEXOL 300 MG/ML  SOLN  Comparison: Radiographs from 12/31/2012  Findings: Mildly heterogeneous splenic and hepatic  enhancement is attributed to contrast phase.  There is contrast medium in the renal collecting systems, indicating an early or test injection of contrast.  This lowers sensitivity for small renal calculi, although no obstructive calculus is present.  Pancreas and adrenal glands unremarkable.  No biliary dilatation observed.  The gallbladder unremarkable in CT appearance.  No pathologic retroperitoneal or porta hepatis adenopathy is identified.  No pathologic pelvic adenopathy is identified.  Appendix normal.  No dilated bowel observed.  There is a small amount of free pelvic fluid in the cul-de-sac.  Transitional S1 level noted with mild disc bulge at the L5-S1 level.  Terminal ileum intact.  Low-level edema at the root the mesentery is nonspecific.  Mildly accentuated enhancement along the left fundal region of the uterus may represent a small fibroid with central necrosis, or incidental fluid in the left cornu of the uterus.  Possible adenomyosis in the anterior uterine body, versus a small fibroid.  IMPRESSION:  1.  Low-level edema at the root the mesentery is nonspecific. 2.  Small amount of free pelvic fluid. 3.  Small rim enhancing lesion in the left uterine fundus, possibly incidental and related to left cornu all endometrial tissue or possibly due to a small fibroid.  Equivocal fibroid or adenomyosis anteriorly in the uterine body. 4.  Appendix unremarkable. 5.  Mild disc bulge at L5-S1.   Original Report Authenticated By: Gaylyn Rong, M.D.    Dg Abd Acute W/chest  12/31/2012  *RADIOLOGY REPORT*  Clinical Data: Abdominal pain.  Nausea and vomiting.  ACUTE ABDOMEN SERIES (ABDOMEN 2 VIEW & CHEST 1 VIEW)  Comparison: None.  Findings: Cardiac and mediastinal contours appear normal.  The lungs appear clear.  No pleural effusion is identified.  Nearly gasless abdomen noted, with scant amounts of gas in nondilated bowel in the pelvis. Transitional S1 segment noted.  Renal shadows unremarkable.  No  significant abnormal normal calcifications.  IMPRESSION:  1.  Nondilated loops of bowel are present in the pelvis, but most of the abdomen is gasless.  This is typically incidental but nonspecific, causing reduced sensitivity and specificity of today's exam.   Original Report Authenticated By: Gaylyn Rong, M.D.    The patient was rechecked x2. The patient has no acute findings on CT scan.  Patient is still having some nausea and treat for this.  We'll give a fluid trial.  Patient is advised return to the emergency department for any worsening in her condition is advised to slowly increase her fluid intake.  Patient is advised followup with her primary care Dr. [facility] urgent care.    MDM  MDM Reviewed: vitals, nursing note and previous chart Interpretation: labs and CT scan           Carlyle Dolly, PA-C 12/31/12 1931

## 2012-12-31 NOTE — ED Notes (Signed)
Pt states that she is unable to use restroom at this time. Pt verbalized understanding that needs to get urine pregnancy before CT scan.

## 2012-12-31 NOTE — ED Notes (Signed)
Pt c/o of abd pain, nausea, vomiting x3 hours ago. Also c/o of chest pain. Denies diarrhea. NAD at this time.

## 2013-01-03 NOTE — ED Provider Notes (Signed)
History/physical exam/procedure(s) were performed by non-physician practitioner and as supervising physician I was immediately available for consultation/collaboration. I have reviewed all notes and am in agreement with care and plan.   Hilario Quarry, MD 01/03/13 [phone]

## 2013-07-18 ENCOUNTER — Other Ambulatory Visit: Payer: Self-pay

## 2013-12-11 ENCOUNTER — Ambulatory Visit (INDEPENDENT_AMBULATORY_CARE_PROVIDER_SITE_OTHER): Payer: 59 | Admitting: Family Medicine

## 2013-12-11 ENCOUNTER — Inpatient Hospital Stay (HOSPITAL_COMMUNITY): Payer: 59

## 2013-12-11 ENCOUNTER — Inpatient Hospital Stay (HOSPITAL_COMMUNITY)
Admission: AD | Admit: 2013-12-11 | Discharge: 2013-12-12 | Disposition: A | Discharge status: Home / Self Care | Payer: 59 | Source: Ambulatory Visit | Attending: Obstetrics & Gynecology | Admitting: Obstetrics & Gynecology

## 2013-12-11 ENCOUNTER — Encounter (HOSPITAL_COMMUNITY): Payer: Self-pay

## 2013-12-11 VITALS — BP 108/70 | HR 102 | Temp 98.8°F | Resp 16 | Ht 65.5 in | Wt 194.0 lb

## 2013-12-11 DIAGNOSIS — O209 Hemorrhage in early pregnancy, unspecified: Secondary | ICD-10-CM | POA: Insufficient documentation

## 2013-12-11 DIAGNOSIS — N898 Other specified noninflammatory disorders of vagina: Secondary | ICD-10-CM

## 2013-12-11 DIAGNOSIS — N939 Abnormal uterine and vaginal bleeding, unspecified: Secondary | ICD-10-CM

## 2013-12-11 DIAGNOSIS — R102 Pelvic and perineal pain: Secondary | ICD-10-CM

## 2013-12-11 DIAGNOSIS — Z3201 Encounter for pregnancy test, result positive: Secondary | ICD-10-CM

## 2013-12-11 DIAGNOSIS — R109 Unspecified abdominal pain: Secondary | ICD-10-CM | POA: Insufficient documentation

## 2013-12-11 DIAGNOSIS — K219 Gastro-esophageal reflux disease without esophagitis: Secondary | ICD-10-CM | POA: Insufficient documentation

## 2013-12-11 DIAGNOSIS — N949 Unspecified condition associated with female genital organs and menstrual cycle: Secondary | ICD-10-CM

## 2013-12-11 LAB — CBC
HEMATOCRIT: 35.6 % — AB (ref 36.0–46.0)
HEMOGLOBIN: 11.5 g/dL — AB (ref 12.0–15.0)
MCH: 28.6 pg (ref 26.0–34.0)
MCHC: 32.3 g/dL (ref 30.0–36.0)
MCV: 88.6 fL (ref 78.0–100.0)
Platelets: 192 10*3/uL (ref 150–400)
RBC: 4.02 MIL/uL (ref 3.87–5.11)
RDW: 13.5 % (ref 11.5–15.5)
WBC: 9 10*3/uL (ref 4.0–10.5)

## 2013-12-11 LAB — POCT URINE PREGNANCY: Preg Test, Ur: POSITIVE

## 2013-12-11 LAB — HCG, QUANTITATIVE, PREGNANCY: hCG, Beta Chain, Quant, S: 567 m[IU]/mL — ABNORMAL HIGH (ref <5)

## 2013-12-11 MED ORDER — OXYCODONE-ACETAMINOPHEN 5-325 MG PO TABS
1.0000 | ORAL_TABLET | Freq: Once | ORAL | Status: AC
  Administered 2013-12-12: 1 via ORAL
  Filled 2013-12-11: qty 1

## 2013-12-11 MED ORDER — OXYCODONE-ACETAMINOPHEN 5-325 MG PO TABS: 1.0000 | ORAL_TABLET | ORAL | Status: DC | PRN

## 2013-12-11 NOTE — Progress Notes (Signed)
   Subjective:    Patient ID: Kirsten Torres, female    DOB: 27-Sep-1970, 42 y.o.   MRN: [id number]   PCP: No PCP Per Patient  Chief Complaint  Patient presents with  . Abdominal Pain    had positive test at home 10 days ago.  . Vaginal Bleeding    Medications, allergies, past medical history, surgical history, family history, social history and problem list reviewed and updated.  HPI  Stopped COC in 12/2012, in order to become pregnant. LMP 10/24/2013. Positive home pregnancy test 10 days ago.  She developed back and abdominal pain 2-3 days ago. She started having some vaginal discharge, and then vaginal bleeding today.  Some nausea, Headache. Pain in the low back extends down both legs. No fever or chills.  No history of similar symptoms previously.  She has two children at home, the youngest born in 2012. Her husband is present today and helps translate.  Review of Systems As above.    Objective:   Physical Exam  Vitals reviewed. Constitutional: She is oriented to person, place, and time. She appears well-developed and well-nourished. She is active and cooperative. No distress (but obviously uncomfortable).  BP 108/70  Pulse 102  Temp(Src) 98.8 F (37.1 C) (Oral)  Resp 16  Ht 5' 5.5" (1.664 m)  Wt 194 lb (87.998 kg)  BMI 31.78 kg/m2  SpO2 98%  LMP 10/24/2013   Eyes: Conjunctivae are normal. No scleral icterus.  Neck: Neck supple. No thyromegaly present.  Cardiovascular: Normal rate, regular rhythm and normal heart sounds.   Pulmonary/Chest: Effort normal and breath sounds normal.  Abdominal: Soft. Bowel sounds are normal. She exhibits no mass. There is tenderness. There is guarding. There is no rebound. Hernia confirmed negative in the right inguinal area and confirmed negative in the left inguinal area.  Genitourinary: Pelvic exam was performed with patient supine. No labial fusion. There is no rash, tenderness, lesion or injury on the right labia. There is no  rash, tenderness, lesion or injury on the left labia. Uterus is tender. Uterus is not deviated and not fixed. There is bleeding around the vagina. No erythema or tenderness around the vagina. No foreign body around the vagina. No signs of injury around the vagina. No vaginal discharge found.  Difficult exam due to patient's discomfort. Os is closed.   Lymphadenopathy:    She has no cervical adenopathy.       Right: No inguinal adenopathy present.       Left: No inguinal adenopathy present.  Neurological: She is alert and oriented to person, place, and time.  Skin: Skin is warm and dry.  Psychiatric: She has a normal mood and affect. Her behavior is normal.   Results for orders placed in visit on 12/11/13  POCT URINE PREGNANCY      Result Value Ref Range   Preg Test, Ur Positive            Assessment & Plan:  1. Positive pregnancy test 2. Vaginal bleeding 3. Pelvic pain in female Suspect spontaneous miscarriage, but given the amount of pain she's experiencing, cannot exclude ectopic. By LMP, she is 6 5/[redacted] weeks pregnant.  To [facility] for additional evaluation and treatment. Spoke with Andee Poles in maternity admissions. - POCT urine pregnancy  Discussed with Dr. Tamala Julian.  Fara Chute, PA-C Physician Assistant-Certified [facility]

## 2013-12-11 NOTE — MAU Note (Signed)
Abdominal pain and back pain for several days rated at 7-8. Vaginal bleeding started today. Nausea but no vomiting. Denies fever. States some burning with urination. LMP 10-24-2013.

## 2013-12-11 NOTE — MAU Note (Signed)
Pt wants to use husband as interpretor. Arabic interpretor offered and patient decline

## 2013-12-11 NOTE — Discharge Instructions (Signed)
Threatened Miscarriage   A threatened miscarriage is a pregnancy that may end. It may be marked by bleeding during the first 20 weeks of pregnancy. Often, the pregnancy can continue without any more problems. You may be asked to stop:  · Having sex (intercourse).  · Having orgasms.  · Using tampons.  · Exercising.  · Doing heavy physical activity and work.  HOME CARE   · Your doctor may tell you to take bed rest and to stop activities and work.  · Write down the number of pads you use each day. Write down how often you change pads. Write down how soaked they are.  · Follow your doctor's advice for follow-up visits and tests.  · If your blood type is Rh-negative and the father's blood is Rh-positive (or is not known), you may get a shot to protect the baby.  · If you have a miscarriage, save all the tissue you pass in a container. Take the container to your doctor.  GET HELP RIGHT AWAY IF:   · You have bad cramps or pain in your belly (abdomen), lower belly, or back.  · You have a fever or chills.  · Your bleeding gets worse or you pass large clots of blood or tissue. Save this tissue to show your doctor.  · You feel lightheaded, weak, dizzy, or pass out (faint).  · You have a gush of fluid from your vagina.  MAKE SURE YOU:   · Understand these instructions.  · Will watch your condition.  · Will get help right away if you are not doing well or get worse.  Document Released: 08/11/2008 Document Revised: 11/21/2011 Document Reviewed: 09/14/2009  ExitCare® Patient Information ©2014 [employer].

## 2013-12-11 NOTE — MAU Provider Note (Signed)
History     CSN: [id number]  Arrival date and time: 12/11/13 2147   First Provider Initiated Contact with Patient 12/11/13 2219      No chief complaint on file.  HPI [name] is a 43 y.o. G3P2002 at [redacted]w[redacted]d who presents to MAU today from Urgent Care for further evaluation of abdominal pain and vaginal bleeding in pregnancy. The patient had a +UPT today. Pelvic exam at Urgent Care showed vaginal bleeding and cervix is closed. The patient rates her pain at 7/10 now. She denies N/V/D, fever or UTI symptoms. She states that the pain started ~ 3 days ago and the bleeding started yesterday. She has had to wear a pad for the bleeding, but is not soaking the pads.   OB History   Grav Para Term Preterm Abortions TAB SAB Ect Mult Living   3 2 2  0 0 0 0 0 0 2      Past Medical History  Diagnosis Date  . GERD (gastroesophageal reflux disease)   . Genital mutilation, female   . SVD (spontaneous vaginal delivery) 05/28/2011    Past Surgical History  Procedure Laterality Date  . Tonsillectomy  2004  . Genital mutilation      History reviewed. No pertinent family history.  History  Substance Use Topics  . Smoking status: Never Smoker   . Smokeless tobacco: Never Used  . Alcohol Use: No    Allergies: No Known Allergies  No prescriptions prior to admission    Review of Systems  Constitutional: Negative for fever and malaise/fatigue.  Gastrointestinal: Positive for abdominal pain. Negative for nausea, vomiting, diarrhea and constipation.  Genitourinary: Negative for dysuria, urgency and frequency.       + vaginal bleeding   Physical Exam   Blood pressure 91/52, pulse 80, temperature 98.1 F (36.7 C), temperature source Oral, resp. rate 18, height 5\' 5"  (1.651 m), weight 87.907 kg (193 lb 12.8 oz), last menstrual period 10/24/2013.  Physical Exam  Constitutional: She is oriented to person, place, and time. She appears well-developed and well-nourished. No distress.   HENT:  Head: Normocephalic and atraumatic.  Cardiovascular: Normal rate, regular rhythm and normal heart sounds.   Respiratory: Effort normal and breath sounds normal. No respiratory distress.  GI: Soft. Bowel sounds are normal. She exhibits no distension. There is tenderness (mild tenderness to palpation of the lower abdomen bilaterallly).  Neurological: She is alert and oriented to person, place, and time.  Skin: Skin is warm and dry. No erythema.  Psychiatric: She has a normal mood and affect.  GU: scant bleeding on pad Pelvic: deferred; was performed at Urgent Care  Results for orders placed during the hospital encounter of 12/11/13 (from the past 24 hour(s))  HCG, QUANTITATIVE, PREGNANCY     Status: Abnormal   Collection Time    12/11/13 10:20 PM      Result Value Ref Range   hCG, Beta Chain, Quant, S 567 (*) <5 mIU/mL  CBC     Status: Abnormal   Collection Time    12/11/13 10:20 PM      Result Value Ref Range   WBC 9.0  4.0 - 10.5 K/uL   RBC 4.02  3.87 - 5.11 MIL/uL   Hemoglobin 11.5 (*) 12.0 - 15.0 g/dL   HCT 35.6 (*) 36.0 - 46.0 %   MCV 88.6  78.0 - 100.0 fL   MCH 28.6  26.0 - 34.0 pg   MCHC 32.3  30.0 - 36.0 g/dL  RDW 13.5  11.5 - 15.5 %   Platelets 192  150 - 400 K/uL   US Ob Comp Less 14 Wks  12/11/2013   CLINICAL DATA:  Lower abdominal pain and vaginal bleeding.  EXAM: OBSTETRIC <14 WK [place] AND TRANSVAGINAL OB US  TECHNIQUE: Both transabdominal and transvaginal ultrasound examinations were performed for complete evaluation of the gestation as well as the maternal uterus, adnexal regions, and pelvic cul-de-sac. Transvaginal technique was performed to assess early pregnancy.  COMPARISON:  CT of the abdomen and pelvis performed 12/31/2012, and pelvic ultrasound performed 10/22/2010  FINDINGS: Intrauterine gestational sac:  None seen.  Yolk sac:  N/A  Embryo:  N/A  Maternal uterus/adnexae: The uterus is unremarkable in appearance, aside from three small intramural or  subserosal leiomyomata, measuring up to 1.3 cm in size.  The ovaries are within normal limits. The right ovary measures 2.9 x 1.8 x 1.3 cm, while the left ovary measures 3.0 x 1.4 x 1.7 cm no suspicious adnexal masses are seen; there is no evidence for ovarian torsion.  A small amount of free fluid is seen within the pelvic cul-de-sac.  IMPRESSION: 1. No intrauterine gestational sac seen at this time. Given the quantitative beta HCG level of 567, an intrauterine gestational sac is not yet expected to be seen. No evidence of ectopic pregnancy at this time. The ovaries are unremarkable in appearance. Would consider follow-up pelvic ultrasound in 1-2 weeks, to assess for intrauterine pregnancy. 2. Incidental note of small fibroids within the uterus.   Electronically Signed   By: Garald Balding M.D.   On: 12/11/2013 23:30   US Ob Transvaginal  12/11/2013   CLINICAL DATA:  Lower abdominal pain and vaginal bleeding.  EXAM: OBSTETRIC <14 WK [place] AND TRANSVAGINAL OB US  TECHNIQUE: Both transabdominal and transvaginal ultrasound examinations were performed for complete evaluation of the gestation as well as the maternal uterus, adnexal regions, and pelvic cul-de-sac. Transvaginal technique was performed to assess early pregnancy.  COMPARISON:  CT of the abdomen and pelvis performed 12/31/2012, and pelvic ultrasound performed 10/22/2010  FINDINGS: Intrauterine gestational sac:  None seen.  Yolk sac:  N/A  Embryo:  N/A  Maternal uterus/adnexae: The uterus is unremarkable in appearance, aside from three small intramural or subserosal leiomyomata, measuring up to 1.3 cm in size.  The ovaries are within normal limits. The right ovary measures 2.9 x 1.8 x 1.3 cm, while the left ovary measures 3.0 x 1.4 x 1.7 cm no suspicious adnexal masses are seen; there is no evidence for ovarian torsion.  A small amount of free fluid is seen within the pelvic cul-de-sac.  IMPRESSION: 1. No intrauterine gestational sac seen at this time. Given  the quantitative beta HCG level of 567, an intrauterine gestational sac is not yet expected to be seen. No evidence of ectopic pregnancy at this time. The ovaries are unremarkable in appearance. Would consider follow-up pelvic ultrasound in 1-2 weeks, to assess for intrauterine pregnancy. 2. Incidental note of small fibroids within the uterus.   Electronically Signed   By: Garald Balding M.D.   On: 12/11/2013 23:30    MAU Course  Procedures None  MDM CBC, quant hCG and [place] today  Assessment and Plan  A: Vaginal bleeding in pregnancy prior to [redacted] weeks gestation  P: Discharge home Bleeding/Ectopic precautions discussed Patient to return to MAU in 48 hours for follow-up labs Patient to return to MAU sooner if symptoms were to change or worsen  Farris Has, PA-C  12/11/2013, 11:43 PM

## 2013-12-11 NOTE — Patient Instructions (Signed)
Go to [facility]. Enter through Maternity Admissions.

## 2013-12-12 NOTE — MAU Provider Note (Signed)
Attestation of Attending Supervision of Advanced Practitioner (PA/CNM/NP): Evaluation and management procedures were performed by the Advanced Practitioner under my supervision and collaboration.  I have reviewed the Advanced Practitioner's note and chart, and I agree with the management and plan.  Allahna Husband, MD, FACOG Attending Obstetrician & Gynecologist Faculty Practice, [facility]  

## 2013-12-13 ENCOUNTER — Inpatient Hospital Stay (HOSPITAL_COMMUNITY)
Admission: AD | Admit: 2013-12-13 | Discharge: 2013-12-13 | Disposition: A | Discharge status: Home / Self Care | Payer: 59 | Source: Ambulatory Visit | Attending: Obstetrics and Gynecology | Admitting: Obstetrics and Gynecology

## 2013-12-13 DIAGNOSIS — R109 Unspecified abdominal pain: Secondary | ICD-10-CM | POA: Insufficient documentation

## 2013-12-13 DIAGNOSIS — N898 Other specified noninflammatory disorders of vagina: Secondary | ICD-10-CM

## 2013-12-13 DIAGNOSIS — N939 Abnormal uterine and vaginal bleeding, unspecified: Secondary | ICD-10-CM

## 2013-12-13 DIAGNOSIS — O209 Hemorrhage in early pregnancy, unspecified: Secondary | ICD-10-CM | POA: Insufficient documentation

## 2013-12-13 LAB — HCG, QUANTITATIVE, PREGNANCY: hCG, Beta Chain, Quant, S: 139 m[IU]/mL — ABNORMAL HIGH (ref <5)

## 2013-12-13 NOTE — MAU Note (Signed)
Patient to MAU for repeat BHCG. Patient states she has a little pain on the left side down to the left leg. States she is having some bleeding, more than a period.

## 2013-12-13 NOTE — MAU Provider Note (Signed)
Kirsten Torres is a 43 y.o. female who presents to the ED for follow up Bhcg. She was evaluated here 2 days ago and the Bhcg was 567. She returns today for follow up. She reports vaginal bleeding and minimal cramping.  Results for orders placed during the hospital encounter of 12/13/13 (from the past 24 hour(s))  HCG, QUANTITATIVE, PREGNANCY     Status: Abnormal   Collection Time    12/13/13  6:01 PM      Result Value Ref Range   hCG, Beta Chain, Quant, S 139 (*) <5 mIU/mL   Discussed with the patient lab findings and plan of care and all questioned fully answered. She will follow up with the [facility] for her Bhcg repeat in one week or return here sooner if any problems arise.

## 2013-12-13 NOTE — Discharge Instructions (Signed)
Your pregnancy hormone level has dropped to 139 today. You will need to have follow up in one week in the GYN office down stairs. They will call you to schedule follow up.

## 2013-12-17 NOTE — MAU Provider Note (Signed)
Attestation of Attending Supervision of Advanced Practitioner: Evaluation and management procedures were performed by the PA/NP/CNM/OB Fellow under my supervision/collaboration. Chart reviewed and agree with management and plan.  Danese Dorsainvil V 12/17/2013 11:05 PM

## 2013-12-20 ENCOUNTER — Other Ambulatory Visit: Payer: 59

## 2013-12-20 DIAGNOSIS — O009 Unspecified ectopic pregnancy without intrauterine pregnancy: Secondary | ICD-10-CM

## 2013-12-20 LAB — HCG, QUANTITATIVE, PREGNANCY: HCG, BETA CHAIN, QUANT, S: 9.9 m[IU]/mL

## 2014-01-28 IMAGING — CR DG ABDOMEN 2V
2 series · 2 of 2 positions shown · non-contrast
Comparison: None.

CLINICAL DATA: Abdominal pain.

ABDOMEN - 2 VIEW

[AP (1 of 2)]
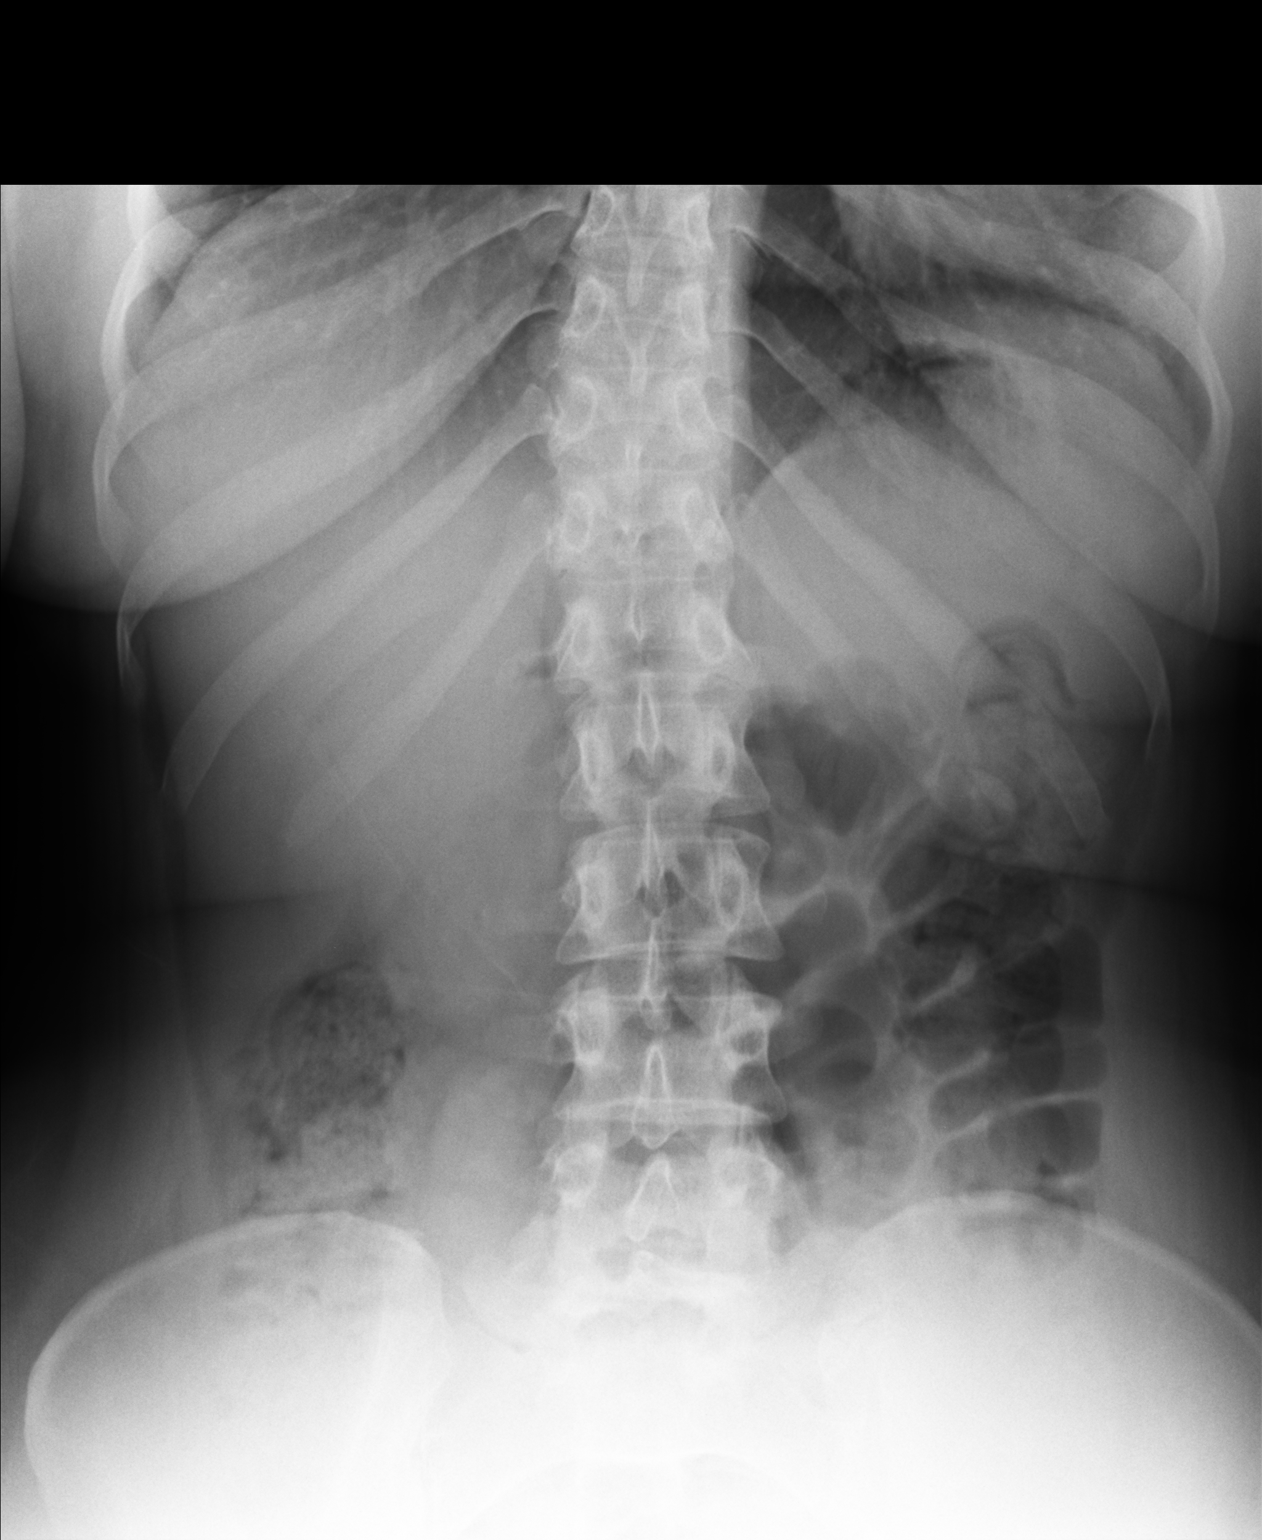

[AP (2 of 2)]
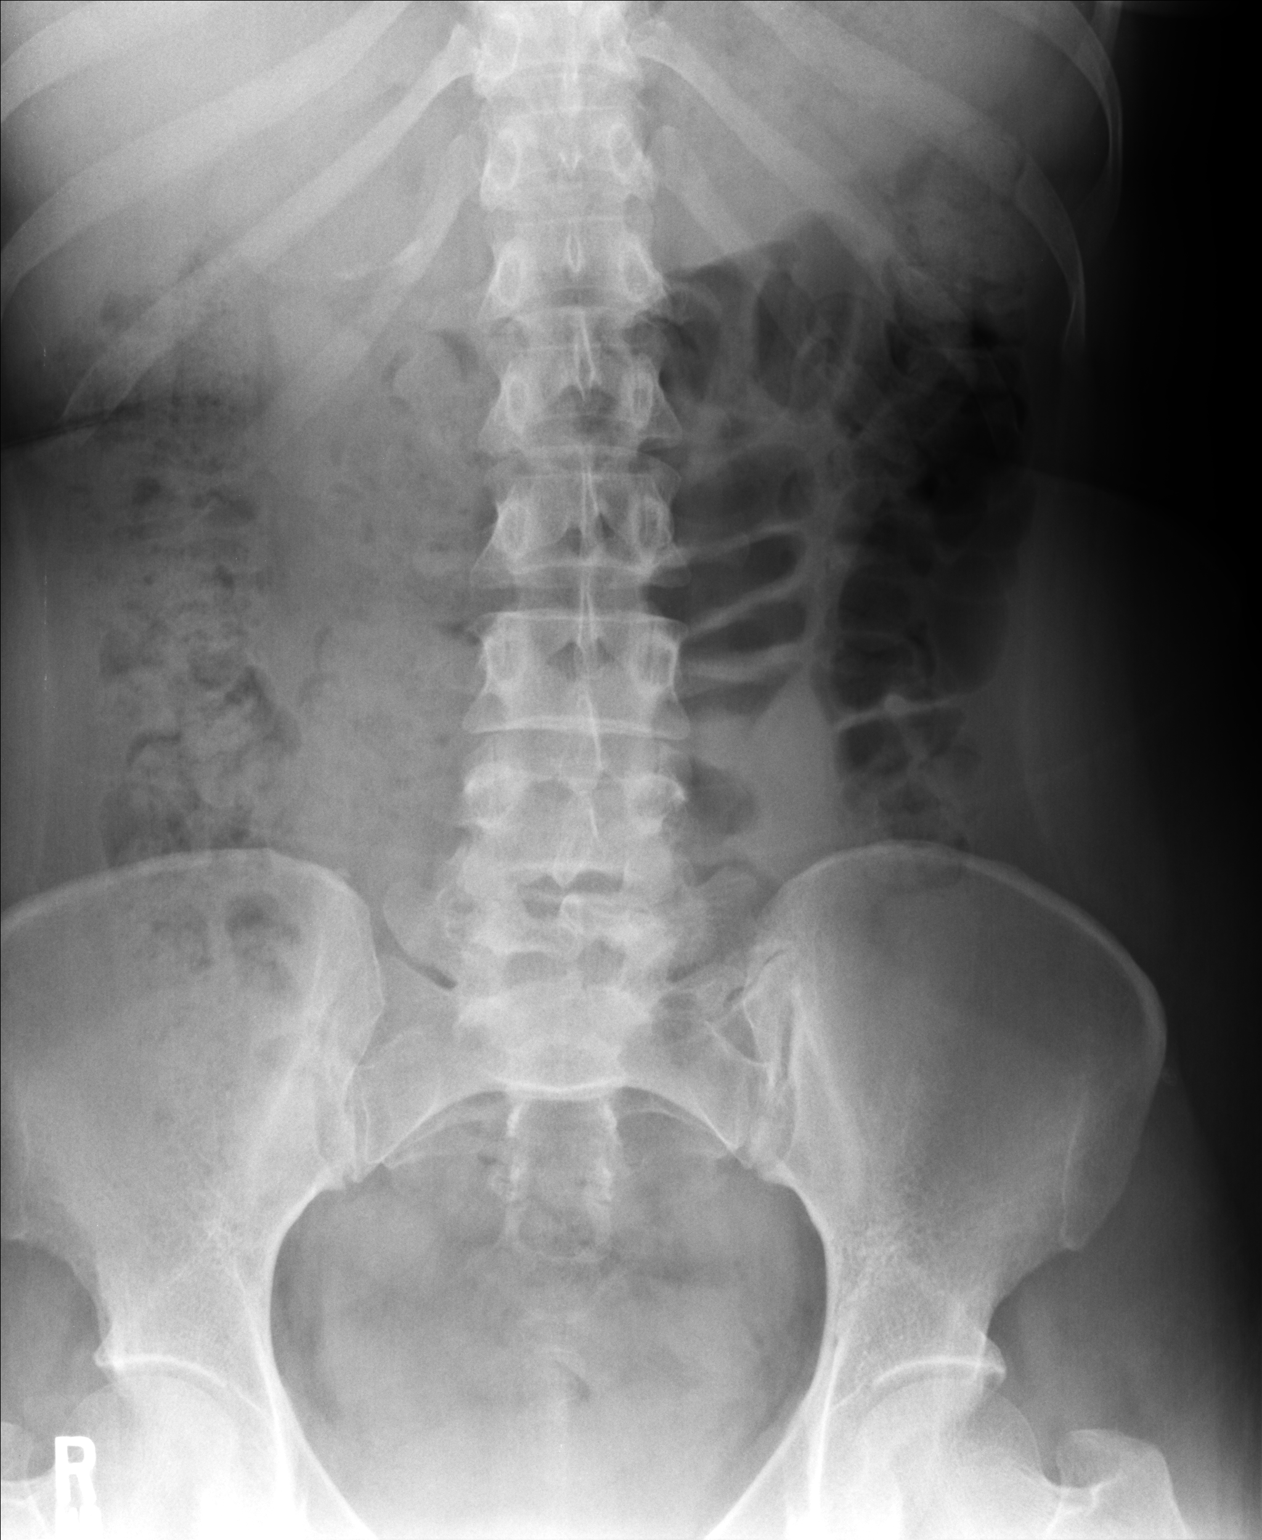

[2 of 2 positions shown; findings below may reference images not displayed]

FINDINGS: There is no free intraperitoneal air.  No evidence of
bowel obstruction is identified.  Large volume of stool ascending
and transverse colon is noted.  No abnormal abdominal calcification
is seen.  No focal bony abnormality.
IMPRESSION: Large stool burden.  Otherwise negative.

## 2014-03-10 ENCOUNTER — Other Ambulatory Visit: Payer: Self-pay | Admitting: General Practice

## 2014-03-10 DIAGNOSIS — R109 Unspecified abdominal pain: Secondary | ICD-10-CM

## 2014-03-11 ENCOUNTER — Encounter (HOSPITAL_COMMUNITY): Payer: Self-pay

## 2014-03-15 IMAGING — CR DG ABDOMEN ACUTE W/ 1V CHEST
3 series · 3 of 3 positions shown · non-contrast
Comparison: None.

CLINICAL DATA: Abdominal pain.  Nausea and vomiting.

ACUTE ABDOMEN SERIES (ABDOMEN 2 VIEW & CHEST 1 VIEW)

[w chest pa]
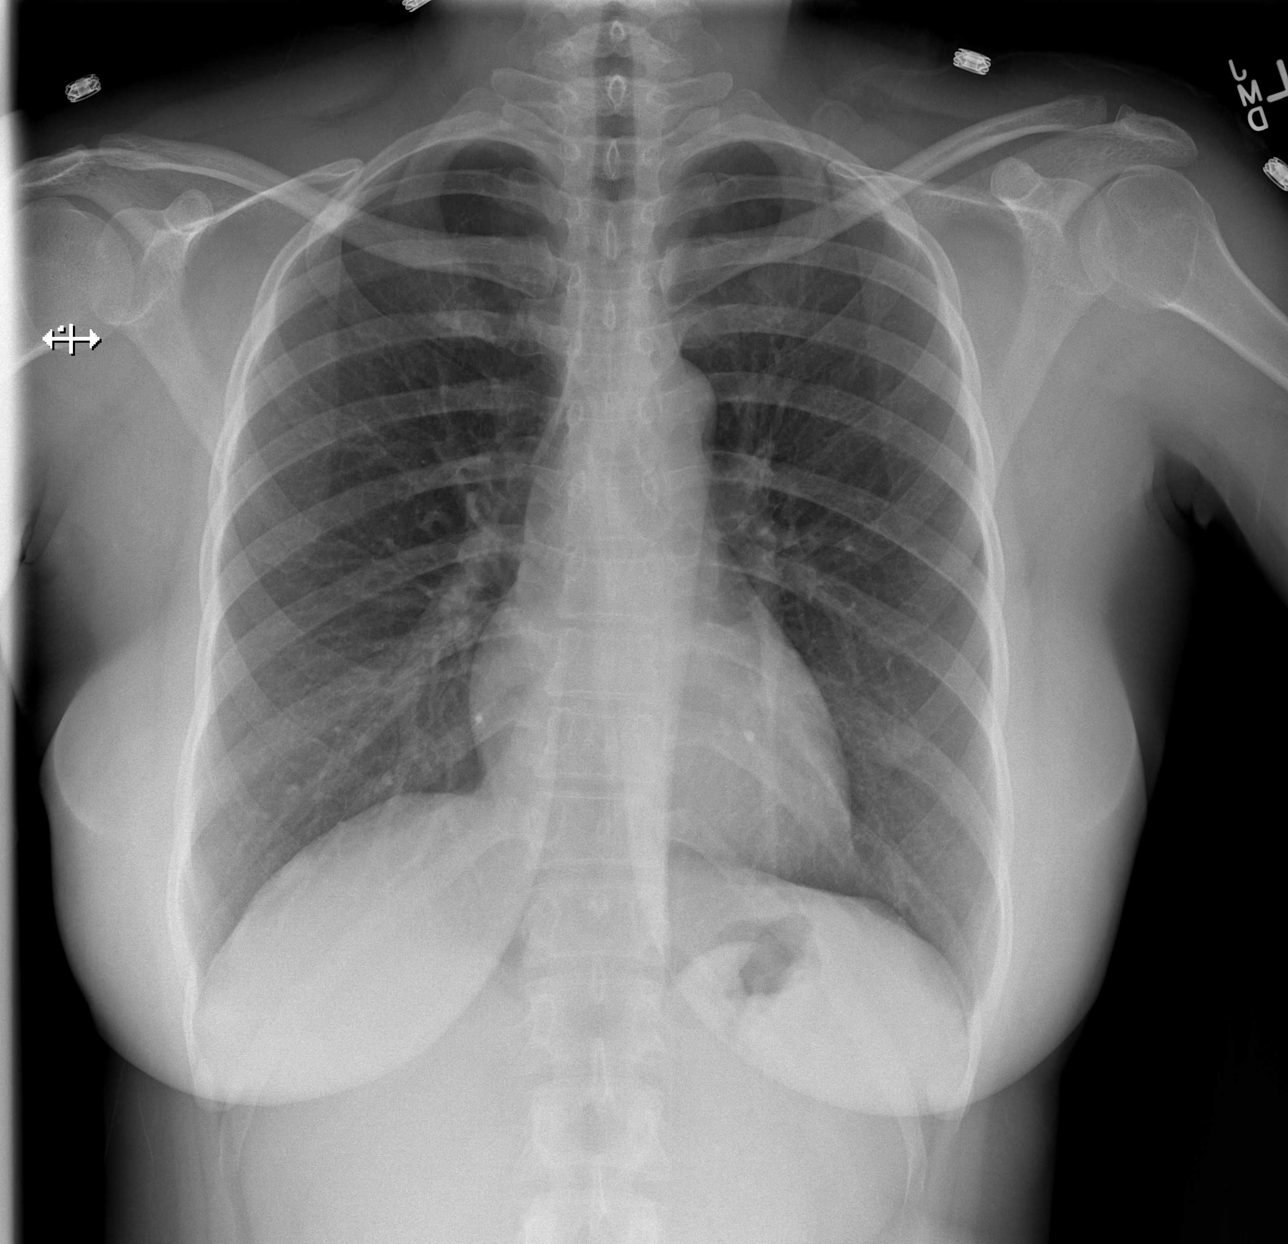

[w abdomen upright]
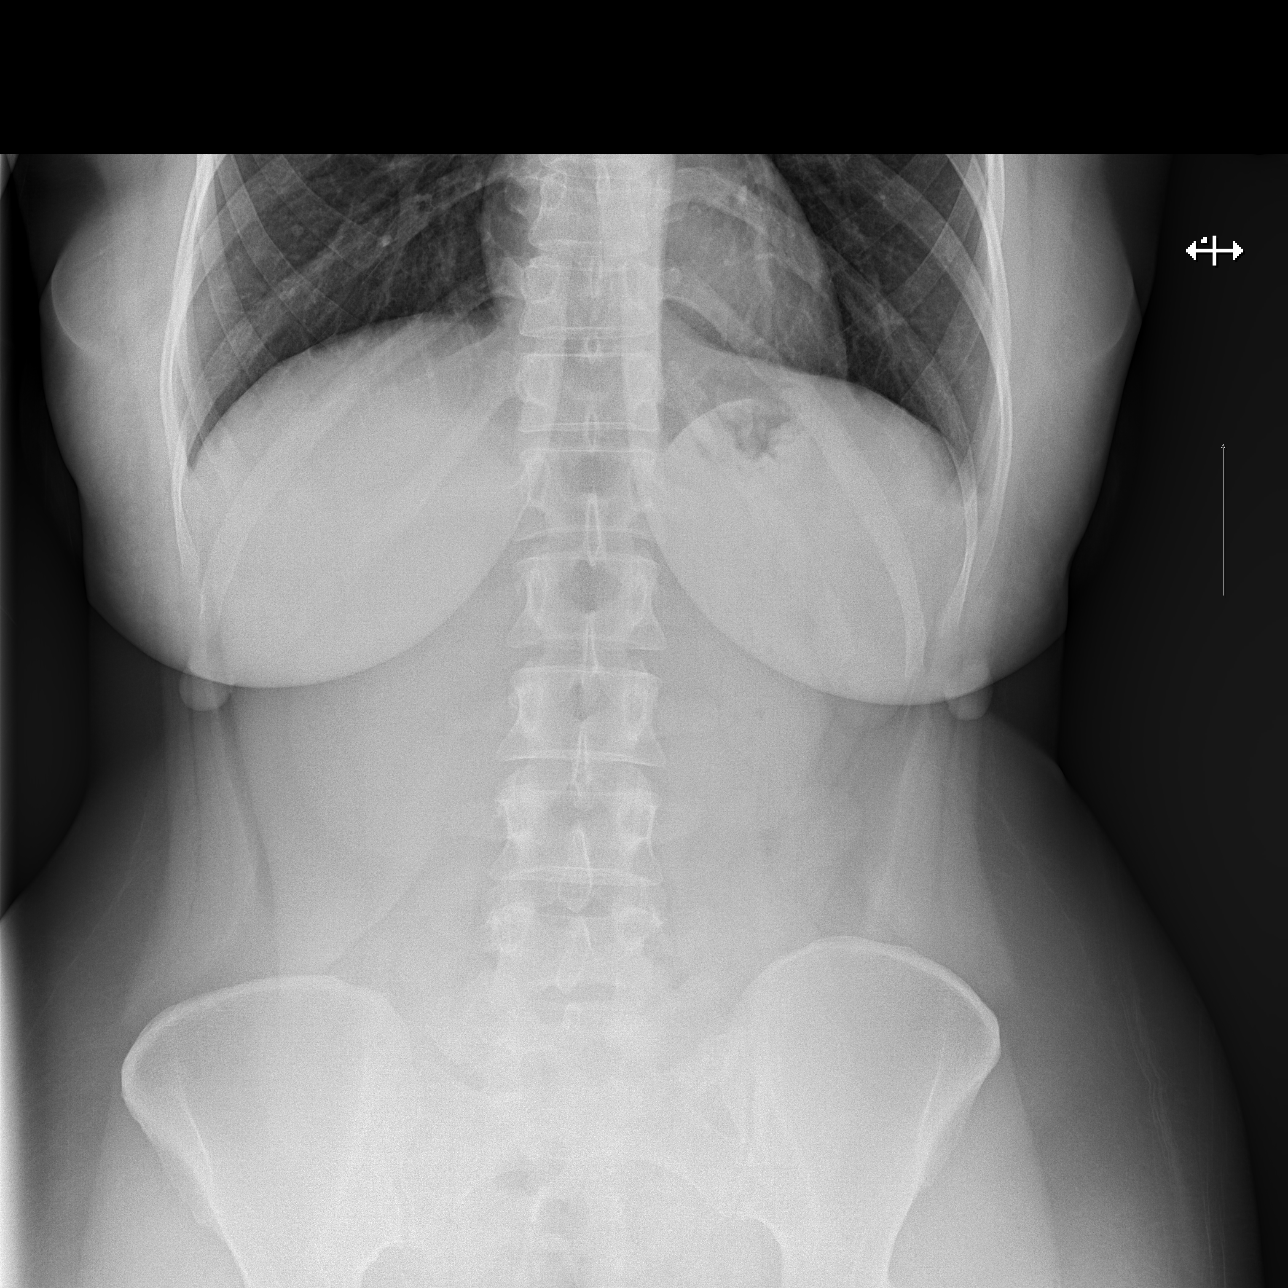

[t abdomen supine]
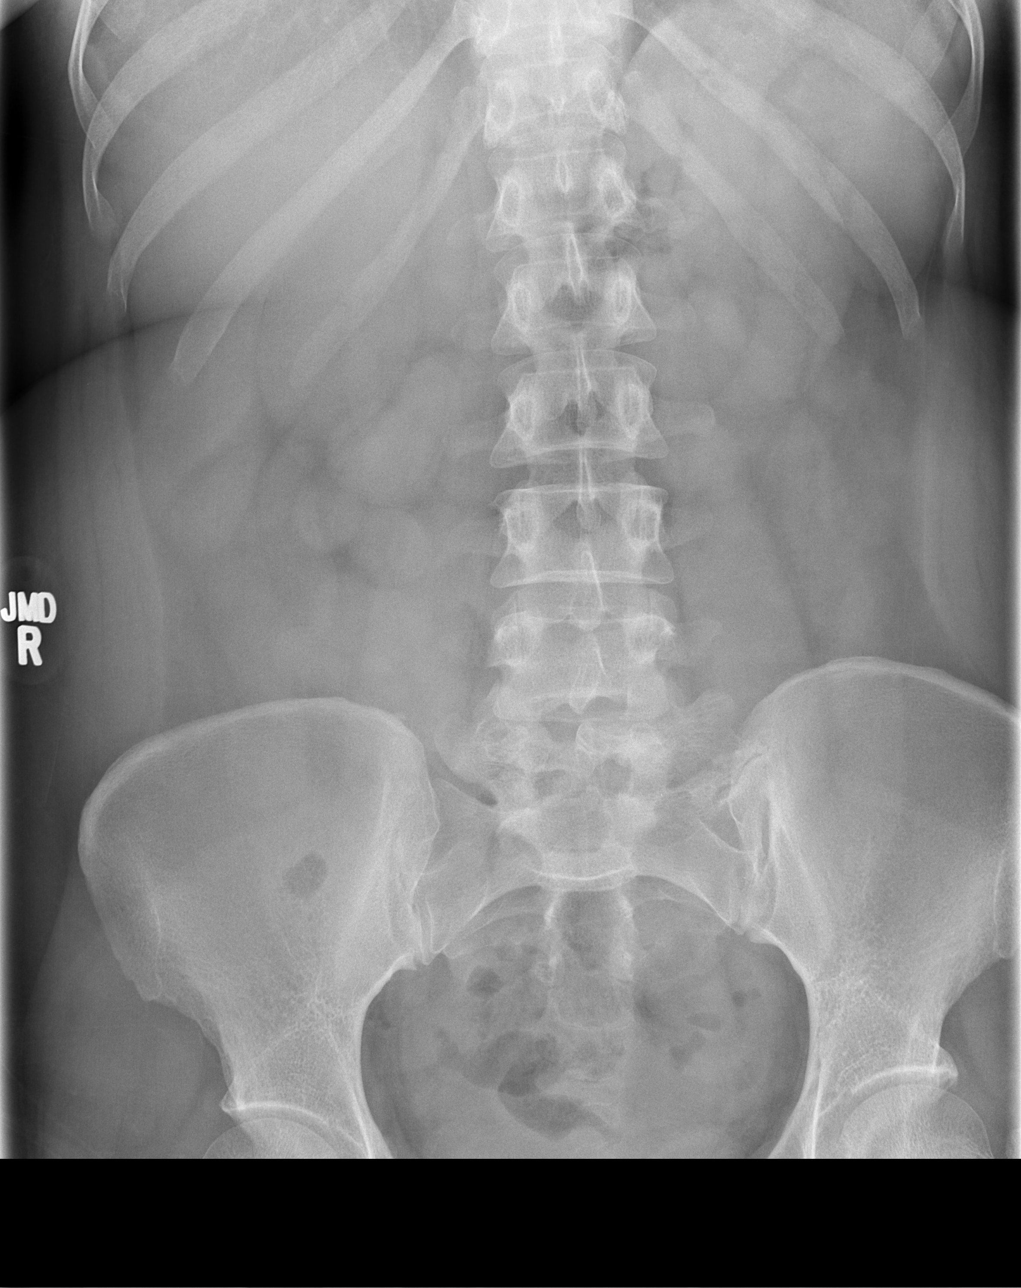

[3 of 3 positions shown; findings below may reference images not displayed]

FINDINGS: Cardiac and mediastinal contours appear normal.

The lungs appear clear.

No pleural effusion is identified.  Nearly gasless abdomen noted,
with scant amounts of gas in nondilated bowel in the pelvis.
Transitional S1 segment noted.  Renal shadows unremarkable.  No
significant abnormal normal calcifications.
IMPRESSION: 1.  Nondilated loops of bowel are present in the pelvis, but most
of the abdomen is gasless.  This is typically incidental but
nonspecific, causing reduced sensitivity and specificity of today's
exam.

## 2014-03-19 ENCOUNTER — Other Ambulatory Visit: Payer: 59

## 2014-03-27 ENCOUNTER — Ambulatory Visit
Admission: RE | Admit: 2014-03-27 | Discharge: 2014-03-27 | Disposition: A | Discharge status: Home / Self Care | Payer: 59 | Source: Ambulatory Visit | Attending: General Practice | Admitting: General Practice

## 2014-03-27 DIAGNOSIS — R109 Unspecified abdominal pain: Secondary | ICD-10-CM

## 2014-04-08 ENCOUNTER — Ambulatory Visit (INDEPENDENT_AMBULATORY_CARE_PROVIDER_SITE_OTHER): Payer: 59 | Admitting: General Surgery

## 2014-05-09 ENCOUNTER — Ambulatory Visit (INDEPENDENT_AMBULATORY_CARE_PROVIDER_SITE_OTHER): Payer: 59 | Admitting: General Surgery

## 2014-05-09 ENCOUNTER — Encounter (INDEPENDENT_AMBULATORY_CARE_PROVIDER_SITE_OTHER): Payer: Self-pay | Admitting: General Surgery

## 2014-05-09 VITALS — BP 100/68 | HR 79 | Temp 98.2°F | Ht 65.0 in | Wt 188.4 lb

## 2014-05-09 DIAGNOSIS — K6289 Other specified diseases of anus and rectum: Secondary | ICD-10-CM

## 2014-05-09 NOTE — Patient Instructions (Signed)
GETTING TO GOOD BOWEL HEALTH. Irregular bowel habits such as constipation can lead to many problems over time.  Having one soft bowel movement a day is the most important way to prevent further problems.  The anorectal canal is designed to handle stretching and feces to safely manage our ability to get rid of solid waste (feces, poop, stool) out of our body.  BUT, hard constipated stools can act like ripping concrete bricks causing inflamed hemorrhoids, anal fissures, abdominal pain and bloating.     The goal: ONE SOFT BOWEL MOVEMENT A DAY!  To have soft, regular bowel movements:    Drink at least 8 tall glasses of water a day.     Take plenty of fiber.  Fiber is the undigested part of plant food that passes into the colon, acting s "natures broom" to encourage bowel motility and movement.  Fiber can absorb and hold large amounts of water. This results in a larger, bulkier stool, which is soft and easier to pass. Work gradually over several weeks up to 6 servings a day of fiber (25g a day even more if needed) in the form of: o Vegetables -- Root (potatoes, carrots, turnips), leafy green (lettuce, salad greens, celery, spinach), or cooked high residue (cabbage, broccoli, etc) o Fruit -- Fresh (unpeeled skin & pulp), Dried (prunes, apricots, cherries, etc ),  or stewed ( applesauce)  o Whole grain breads, pasta, etc (whole wheat)  o Bran cereals    Bulking Agents -- This type of water-retaining fiber generally is easily obtained each day by one of the following:  o Psyllium bran -- The psyllium plant is remarkable because its ground seeds can retain so much water. This product is available as Metamucil, Konsyl, Effersyllium, Per Diem Fiber, or the less expensive generic preparation in drug and health food stores. Although labeled a laxative, it really is not a laxative.  o Methylcellulose -- This is another fiber derived from wood which also retains water. It is available as Citrucel. o Polyethylene Glycol  - and "artificial" fiber commonly called Miralax or Glycolax.  It is helpful for people with gassy or bloated feelings with regular fiber o Flax Seed - a less gassy fiber than psyllium   No reading or other relaxing activity while on the toilet. If bowel movements take longer than 5 minutes, you are too constipated.   AVOID CONSTIPATION.  High fiber and water intake usually takes care of this.  Sometimes a laxative is needed to stimulate more frequent bowel movements, but    Laxatives are not a good long-term solution as it can wear the colon out. o Osmotics (Milk of Magnesia, Fleets phosphosoda, Magnesium citrate, MiraLax, GoLytely) are safer than  o Stimulants (Senokot, Castor Oil, Dulcolax, Ex Lax)    o Do not take laxatives for more than 7days in a row.    IF SEVERELY CONSTIPATED, try a Bowel Retraining Program: o Do not use laxatives.  o Eat a diet high in roughage, such as bran cereals and leafy vegetables.  o Drink six (6) ounces of prune or apricot juice each morning.  o Eat two (2) large servings of stewed fruit each day.  o Take one (1) heaping tablespoon of a psyllium-based bulking agent twice a day. Use sugar-free sweetener when possible to avoid excessive calories.  o Eat a normal breakfast.  o Set aside 15 minutes after breakfast to sit on the toilet, but do not strain to have a bowel movement.  o If you do  not have a bowel movement by the third day, use an enema and repeat the above steps.   Fiber Chart  You should 25-30g of fiber per day and drinking 8 glasses of water to help your bowels move regularly.  In the chart below you can look up how much fiber you are getting in an average day.  If you are not getting enough fiber, you should add a fiber supplement to your diet.  Examples of this include Metamucil, FiberCon and Citrucel.  These can be purchased at your local grocery store or pharmacy.       http://www.[employer].edu/offices/health/nutritioncoach/AtoZ/handouts/Fiber.pdf  

## 2014-05-09 NOTE — Progress Notes (Signed)
Chief Complaint  Patient presents with  . rectal pain/hem    HISTORY: Kirsten Torres is a 43 y.o. female who presents to the office with anal pain.  Other symptoms include bleeding occasionally.  This had been occurring for several years.  she has tried creams and suppositories in the past with some success.  Her menstrual cycle makes the symptoms worse.   It is intermittent in nature.  her bowel habits are regular and her bowel movements are usually hard.  her fiber intake is dietary.  She did have some trauma with childbirth.    Past Medical History  Diagnosis Date  . GERD (gastroesophageal reflux disease)   . Genital mutilation, female   . SVD (spontaneous vaginal delivery) 05/28/2011      Past Surgical History  Procedure Laterality Date  . Tonsillectomy  2004  . Genital mutilation          No current outpatient prescriptions on file.   No current facility-administered medications for this visit.      No Known Allergies    History reviewed. No pertinent family history.  History   Social History  . Marital Status: Married    Spouse Name: N/A    Number of Children: 2  . Years of Education: master's   Occupational History  . homemaker    Social History Main Topics  . Smoking status: Never Smoker   . Smokeless tobacco: Never Used  . Alcohol Use: No  . Drug Use: No  . Sexual Activity: Yes    Partners: Male    Birth Control/ Protection: None     Comment: stopped COC to become pregnant   Other Topics Concern  . None   Social History Narrative   From [place]. Came to the [place] in 2011. Master's Degree in [employer].   Lives with her husband (came here from [place] in 2004) and their 2 children.      REVIEW OF SYSTEMS - PERTINENT POSITIVES ONLY: Review of Systems - General ROS: negative for - chills, fever or weight loss Hematological and Lymphatic ROS: negative for - bleeding problems, blood clots or bruising Respiratory ROS: no cough, shortness of breath, or  wheezing Cardiovascular ROS: no chest pain or dyspnea on exertion Gastrointestinal ROS: positive for - abdominal pain and constipation negative for - diarrhea or nausea/vomiting Genito-Urinary ROS: no dysuria, trouble voiding, or hematuria  EXAM: Filed Vitals:   05/09/14 1559  BP: 100/68  Pulse: 79  Temp: 98.2 F (36.8 C)    General appearance: alert and cooperative Resp: clear to auscultation bilaterally Cardio: regular rate and rhythm GI: normal findings: soft, non-tender  Procedure: Anoscopy Surgeon: Kirsten Torres Diagnosis: anal pain, abnormal defecation  Assistant: Kirsten Torres After the risks and benefits were explained, verbal consent was obtained for above procedure  Anesthesia: none Findings: slight sphincter htn, grade 1 internal hemorrhoids, no fissure    ASSESSMENT AND PLAN: Kirsten Torres is a 43 y.o. female from [place] with anal pain.  Her exam is normal except for some sphincter htn. I believe most of her symptoms are mediated by her constipation. I've asked her to start a fiber supplement and high fiber diet with plenty of liquids. I have given her literature on this as well. I will see her back in the office as needed.     Kirsten Adie, MD Colon and Rectal Surgery / [facility]      Visit Diagnoses: 1. Anal pain  Primary Care Physician: No PCP Per Patient

## 2014-06-08 ENCOUNTER — Ambulatory Visit (INDEPENDENT_AMBULATORY_CARE_PROVIDER_SITE_OTHER): Payer: 59 | Admitting: Internal Medicine

## 2014-06-08 VITALS — BP 98/72 | HR 82 | Temp 98.0°F | Resp 16 | Ht 65.5 in | Wt 189.1 lb

## 2014-06-08 DIAGNOSIS — E229 Hyperfunction of pituitary gland, unspecified: Secondary | ICD-10-CM

## 2014-06-08 DIAGNOSIS — R51 Headache: Secondary | ICD-10-CM

## 2014-06-08 DIAGNOSIS — E221 Hyperprolactinemia: Secondary | ICD-10-CM

## 2014-06-08 DIAGNOSIS — H5713 Ocular pain, bilateral: Secondary | ICD-10-CM

## 2014-06-08 DIAGNOSIS — H5710 Ocular pain, unspecified eye: Secondary | ICD-10-CM

## 2014-06-08 DIAGNOSIS — N643 Galactorrhea not associated with childbirth: Secondary | ICD-10-CM

## 2014-06-08 LAB — COMPREHENSIVE METABOLIC PANEL
ALBUMIN: 3.9 g/dL (ref 3.5–5.2)
ALK PHOS: 48 U/L (ref 39–117)
ALT: <8 U/L (ref 0–35)
AST: 12 U/L (ref 0–37)
BILIRUBIN TOTAL: 0.2 mg/dL (ref 0.2–1.2)
BUN: 10 mg/dL (ref 6–23)
CO2: 29 mEq/L (ref 19–32)
CREATININE: 0.65 mg/dL (ref 0.50–1.10)
Calcium: 9.3 mg/dL (ref 8.4–10.5)
Chloride: 104 mEq/L (ref 96–112)
GLUCOSE: 90 mg/dL (ref 70–99)
POTASSIUM: 4.3 meq/L (ref 3.5–5.3)
Sodium: 137 mEq/L (ref 135–145)
Total Protein: 6.9 g/dL (ref 6.0–8.3)

## 2014-06-08 LAB — PROLACTIN: PROLACTIN: 82.9 ng/mL

## 2014-06-08 LAB — POCT CBC
Granulocyte percent: 51.2 %G (ref 37–80)
HCT, POC: 38.5 % (ref 37.7–47.9)
Hemoglobin: 12.4 g/dL (ref 12.2–16.2)
Lymph, poc: 2.6 (ref 0.6–3.4)
MCH: 28.1 pg (ref 27–31.2)
MCHC: 32.1 g/dL (ref 31.8–35.4)
MCV: 87.5 fL (ref 80–97)
MID (CBC): 0.4 (ref 0–0.9)
MPV: 7.9 fL (ref 0–99.8)
PLATELET COUNT, POC: 224 10*3/uL (ref 142–424)
POC Granulocyte: 3.1 (ref 2–6.9)
POC LYMPH %: 42.6 % (ref 10–50)
POC MID %: 6.2 % (ref 0–12)
RBC: 4.4 M/uL (ref 4.04–5.48)
RDW, POC: 14.1 %
WBC: 6.1 10*3/uL (ref 4.6–10.2)

## 2014-06-08 LAB — POCT URINE PREGNANCY: Preg Test, Ur: NEGATIVE

## 2014-06-08 LAB — TSH: TSH: 0.568 u[IU]/mL (ref 0.350–4.500)

## 2014-06-08 MED ORDER — BETAMETHASONE DIPROPIONATE 0.05 % EX CREA: TOPICAL_CREAM | Freq: Two times a day (BID) | CUTANEOUS | Status: DC

## 2014-06-08 MED ORDER — KETOCONAZOLE 2 % EX CREA: 1.0000 "application " | TOPICAL_CREAM | Freq: Two times a day (BID) | CUTANEOUS | Status: DC

## 2014-06-08 NOTE — Progress Notes (Signed)
Subjective:    Patient ID: Kirsten Torres, female    DOB: 01-31-1971, 43 y.o.   MRN: [id number]  This chart was scribed for Tami Lin, MD by Erling Conte, Medical Scribe. This patient was seen in Room 2 and the patient's care was started at 1:40 PM.    Chief Complaint  Patient presents with  . Recurrent Skin Infections    ?Boil under left arm  . Sore under eyes   HPI HPI Comments: Kirsten Torres is a 43 y.o. female who presents to [facility] complaining of an itchy, patchy, rash under her left arm in her armpit region. She states that she has had it before previously and it eventually went away with an antibiotic cream. The rash has had some drainage. She also notes some mild drainage and pain from her left breast. She notes that the drainage from her left breast seemed milky and white. Her LNMP was on 05/23/14. She states that period was normal. She does not use any form of contraception. Has 2 kids. Note probable miscarriage in spring 2015 in chart. Not pregnant since. No h/o DM. No family history of breast issues or breast cancer.  She has noted frontal left sided headache in recent weeks.  Second complaint of gradually worsening soreness in her eyes for 2 days. She states it is worse on the left side. She feels as if something is possibly in her eye. Pt has not witnessed any foreign bodies in her eye. She denies any blurry vision or other change in vision  There are no active problems to display for this patient.  Past Medical History  Diagnosis Date  . GERD (gastroesophageal reflux disease)   . Genital mutilation, female   . SVD (spontaneous vaginal delivery) 05/28/2011   Past Surgical History  Procedure Laterality Date  . Tonsillectomy  2004  . Genital mutilation     No Known Allergies Prior to Admission medications   Not on File   History   Social History  . Marital Status: Married    Spouse Name: N/A    Number of Children: 2  .  Years of Education: master's   Occupational History  . homemaker    Social History Main Topics  . Smoking status: Never Smoker   . Smokeless tobacco: Never Used  . Alcohol Use: No  . Drug Use: No  . Sexual Activity: Yes    Partners: Male    Birth Control/ Protection: None     Comment: stopped COC to become pregnant   Other Topics Concern  . Not on file   Social History Narrative   From [place]. Came to the [place] in 2011. Master's Degree in [employer].   Lives with her husband (came here from [place] in 2004) and their 2 children.     Review of Systems  Constitutional: Negative for fever, chills and fatigue.  Eyes: Positive for pain. Negative for visual disturbance.  Gastrointestinal: Negative for nausea, vomiting and abdominal pain.  Neurological: Negative for dizziness and light-headedness.  A complete 10 system review of systems was obtained and all systems are negative except as noted in the HPI and PMH.       Objective:   Physical Exam  Nursing note and vitals reviewed. Constitutional: She is oriented to person, place, and time. She appears well-developed and well-nourished. No distress.  HENT:  Head: Normocephalic and atraumatic.  Eyes: Conjunctivae and EOM are normal. Pupils are equal, round, and reactive  to light.  Vision intact with Snellen except some problems telling the difference O and C or perhaps not seeing the completion of the "O" Lids are clear/no foreign body seen/no conjunctival irritation  Neck: Neck supple. No thyromegaly present.  Cardiovascular: Normal rate, regular rhythm and normal heart sounds.   Pulmonary/Chest: Effort normal. No respiratory distress. Right breast exhibits nipple discharge. Right breast exhibits no inverted nipple, no mass, no skin change and no tenderness. Left breast exhibits nipple discharge. Left breast exhibits no inverted nipple, no mass, no skin change and no tenderness. Breasts are symmetrical.  With best stimulation milky  discharge from each nipple is found  Musculoskeletal: Normal range of motion. She exhibits no edema.  Lymphadenopathy:    She has no cervical adenopathy.  Neurological: She is alert and oriented to person, place, and time. She has normal reflexes. No cranial nerve deficit. She exhibits normal muscle tone. Coordination normal.  Skin: Skin is warm and dry.  Left axilla with erythema and a scaly border covering the entire area  Psychiatric: She has a normal mood and affect. Her behavior is normal.    Filed Vitals:   06/08/14 1217  BP: 98/72  Pulse: 82  Temp: 98 F (36.7 C)  TempSrc: Oral  Resp: 16  Height: 5' 5.5" (1.664 m)  Weight: 189 lb 2 oz (85.787 kg)  SpO2: 100%       Assessment & Plan:  Eye pain, bilateral - Plan: POCT CBC, TSH, Comprehensive metabolic panel  EZMOQHUT(654.6) - Plan: POCT CBC, TSH, Comprehensive metabolic panel  Galactorrhea - Plan: POCT urine pregnancy, Prolactin  Intertrigo-axillae Meds ordered this encounter  Medications  . ketoconazole (NIZORAL) 2 % cream    Sig: Apply 1 application topically 2 (two) times daily.    Dispense:  30 g    Refill:  1  . betamethasone dipropionate (DIPROLENE) 0.05 % cream    Sig: Apply topically 2 (two) times daily. Only if needed for itching    Dispense:  15 g    Refill:  0   consider oral antifungal if not resolving   I personally performed the services described in this documentation, which was scribed in my presence. The recorded information has been reviewed and is accurate.   adden 9/28: Results for orders placed in visit on 06/08/14  TSH      Result Value Ref Range   TSH 0.568  0.350 - 4.500 uIU/mL  COMPREHENSIVE METABOLIC PANEL      Result Value Ref Range   Sodium 137  135 - 145 mEq/L   Potassium 4.3  3.5 - 5.3 mEq/L   Chloride 104  96 - 112 mEq/L   CO2 29  19 - 32 mEq/L   Glucose, Bld 90  70 - 99 mg/dL   BUN 10  6 - 23 mg/dL   Creat 0.65  0.50 - 1.10 mg/dL   Total Bilirubin 0.2  0.2 - 1.2 mg/dL     Alkaline Phosphatase 48  39 - 117 U/L   AST 12  0 - 37 U/L   ALT <8  0 - 35 U/L   Total Protein 6.9  6.0 - 8.3 g/dL   Albumin 3.9  3.5 - 5.2 g/dL   Calcium 9.3  8.4 - 10.5 mg/dL  PROLACTIN      Result Value Ref Range   Prolactin 82.9    POCT CBC      Result Value Ref Range   WBC 6.1  4.6 - 10.2 K/uL  Lymph, poc 2.6  0.6 - 3.4   POC LYMPH PERCENT 42.6  10 - 50 %L   MID (cbc) 0.4  0 - 0.9   POC MID % 6.2  0 - 12 %M   POC Granulocyte 3.1  2 - 6.9   Granulocyte percent 51.2  37 - 80 %G   RBC 4.40  4.04 - 5.48 M/uL   Hemoglobin 12.4  12.2 - 16.2 g/dL   HCT, POC 38.5  37.7 - 47.9 %   MCV 87.5  80 - 97 fL   MCH, POC 28.1  27 - 31.2 pg   MCHC 32.1  31.8 - 35.4 g/dL   RDW, POC 14.1     Platelet Count, POC 224  142 - 424 K/uL   MPV 7.9  0 - 99.8 fL  POCT URINE PREGNANCY      Result Value Ref Range   Preg Test, Ur Negative     Will need imaging of sella

## 2014-06-17 ENCOUNTER — Other Ambulatory Visit: Payer: Self-pay | Admitting: Radiology

## 2014-06-17 DIAGNOSIS — E221 Hyperprolactinemia: Secondary | ICD-10-CM

## 2014-06-17 DIAGNOSIS — H5713 Ocular pain, bilateral: Secondary | ICD-10-CM

## 2014-06-18 ENCOUNTER — Other Ambulatory Visit: Payer: Self-pay | Admitting: Internal Medicine

## 2014-06-18 ENCOUNTER — Ambulatory Visit
Admission: RE | Admit: 2014-06-18 | Discharge: 2014-06-18 | Disposition: A | Discharge status: Home / Self Care | Payer: 59 | Source: Ambulatory Visit | Attending: Internal Medicine | Admitting: Internal Medicine

## 2014-06-18 ENCOUNTER — Inpatient Hospital Stay: Admission: RE | Admit: 2014-06-18 | Payer: 59 | Source: Ambulatory Visit

## 2014-06-18 DIAGNOSIS — E221 Hyperprolactinemia: Secondary | ICD-10-CM

## 2014-06-18 DIAGNOSIS — N643 Galactorrhea not associated with childbirth: Secondary | ICD-10-CM

## 2014-06-18 DIAGNOSIS — H5713 Ocular pain, bilateral: Secondary | ICD-10-CM

## 2014-06-18 MED ORDER — GADOBENATE DIMEGLUMINE 529 MG/ML IV SOLN
9.0000 mL | Freq: Once | INTRAVENOUS | Status: AC | PRN
  Administered 2014-06-18: 9 mL via INTRAVENOUS

## 2014-06-19 ENCOUNTER — Other Ambulatory Visit: Payer: Self-pay | Admitting: Internal Medicine

## 2014-06-19 DIAGNOSIS — E221 Hyperprolactinemia: Secondary | ICD-10-CM

## 2014-06-19 DIAGNOSIS — N643 Galactorrhea not associated with childbirth: Secondary | ICD-10-CM

## 2014-06-19 DIAGNOSIS — D352 Benign neoplasm of pituitary gland: Secondary | ICD-10-CM

## 2014-07-16 NOTE — Progress Notes (Signed)
History and physical examinations reviewed with Harrison Mons, PA-C.  Agree with assessment and plan.

## 2015-08-24 ENCOUNTER — Ambulatory Visit: Payer: Self-pay | Attending: Internal Medicine

## 2015-11-12 ENCOUNTER — Ambulatory Visit: Payer: Self-pay | Admitting: Family Medicine

## 2015-11-16 ENCOUNTER — Ambulatory Visit (INDEPENDENT_AMBULATORY_CARE_PROVIDER_SITE_OTHER): Payer: No Typology Code available for payment source | Admitting: Family Medicine

## 2015-11-16 ENCOUNTER — Encounter: Payer: Self-pay | Admitting: Family Medicine

## 2015-11-16 VITALS — BP 103/66 | HR 101 | Temp 99.2°F | Resp 14 | Ht 65.0 in | Wt 199.0 lb

## 2015-11-16 DIAGNOSIS — Z1239 Encounter for other screening for malignant neoplasm of breast: Secondary | ICD-10-CM

## 2015-11-16 DIAGNOSIS — K219 Gastro-esophageal reflux disease without esophagitis: Secondary | ICD-10-CM

## 2015-11-16 DIAGNOSIS — R079 Chest pain, unspecified: Secondary | ICD-10-CM

## 2015-11-16 DIAGNOSIS — E669 Obesity, unspecified: Secondary | ICD-10-CM

## 2015-11-16 DIAGNOSIS — K5900 Constipation, unspecified: Secondary | ICD-10-CM

## 2015-11-16 DIAGNOSIS — R5383 Other fatigue: Secondary | ICD-10-CM | POA: Insufficient documentation

## 2015-11-16 LAB — CBC WITH DIFFERENTIAL/PLATELET
BASOS ABS: 0 10*3/uL (ref 0.0–0.1)
Basophils Relative: 0 % (ref 0–1)
Eosinophils Absolute: 0.1 10*3/uL (ref 0.0–0.7)
Eosinophils Relative: 1 % (ref 0–5)
HEMATOCRIT: 35.9 % — AB (ref 36.0–46.0)
HEMOGLOBIN: 12.1 g/dL (ref 12.0–15.0)
LYMPHS PCT: 38 % (ref 12–46)
Lymphs Abs: 2.2 10*3/uL (ref 0.7–4.0)
MCH: 28.7 pg (ref 26.0–34.0)
MCHC: 33.7 g/dL (ref 30.0–36.0)
MCV: 85.3 fL (ref 78.0–100.0)
MPV: 10.1 fL (ref 8.6–12.4)
Monocytes Absolute: 0.5 10*3/uL (ref 0.1–1.0)
Monocytes Relative: 9 % (ref 3–12)
NEUTROS ABS: 3 10*3/uL (ref 1.7–7.7)
NEUTROS PCT: 52 % (ref 43–77)
Platelets: 235 10*3/uL (ref 150–400)
RBC: 4.21 MIL/uL (ref 3.87–5.11)
RDW: 13.4 % (ref 11.5–15.5)
WBC: 5.7 10*3/uL (ref 4.0–10.5)

## 2015-11-16 LAB — COMPLETE METABOLIC PANEL WITH GFR
ALT: 5 U/L — AB (ref 6–29)
AST: 10 U/L (ref 10–30)
Albumin: 3.8 g/dL (ref 3.6–5.1)
Alkaline Phosphatase: 57 U/L (ref 33–115)
BUN: 11 mg/dL (ref 7–25)
CHLORIDE: 104 mmol/L (ref 98–110)
CO2: 26 mmol/L (ref 20–31)
CREATININE: 0.61 mg/dL (ref 0.50–1.10)
Calcium: 8.7 mg/dL (ref 8.6–10.2)
GFR, Est Non African American: >89 mL/min (ref >=60)
Glucose, Bld: 98 mg/dL (ref 65–99)
POTASSIUM: 3.9 mmol/L (ref 3.5–5.3)
Sodium: 139 mmol/L (ref 135–146)
Total Bilirubin: 0.2 mg/dL (ref 0.2–1.2)
Total Protein: 6.6 g/dL (ref 6.1–8.1)

## 2015-11-16 LAB — POCT URINALYSIS DIP (DEVICE)
BILIRUBIN URINE: NEGATIVE
GLUCOSE, UA: NEGATIVE mg/dL
Hgb urine dipstick: NEGATIVE
Leukocytes, UA: NEGATIVE
Nitrite: NEGATIVE
Protein, ur: NEGATIVE mg/dL
Specific Gravity, Urine: 1.025 (ref 1.005–1.030)
Urobilinogen, UA: 0.2 mg/dL (ref 0.0–1.0)
pH: 5.5 (ref 5.0–8.0)

## 2015-11-16 LAB — HEMOGLOBIN A1C
HEMOGLOBIN A1C: 5.3 % (ref <5.7)
MEAN PLASMA GLUCOSE: 105 mg/dL (ref <117)

## 2015-11-16 LAB — TSH: TSH: 0.78 mIU/L

## 2015-11-16 MED ORDER — POLYETHYLENE GLYCOL 3350 17 GM/SCOOP PO POWD: 17.0000 g | Freq: Two times a day (BID) | ORAL | Status: DC | PRN

## 2015-11-16 MED ORDER — OMEPRAZOLE 20 MG PO CPDR: 20.0000 mg | DELAYED_RELEASE_CAPSULE | Freq: Every day | ORAL | Status: DC

## 2015-11-16 MED FILL — OMEPRAZOLE DR 20 MG CAPSULE: 20 | 42 days supply | Qty: 42 | Fill #0

## 2015-11-16 MED FILL — POLYETHYLENE GLYCOL 3350: 15 days supply | Qty: 510 | Fill #0

## 2015-11-16 NOTE — Progress Notes (Signed)
Subjective:    Patient ID: Kirsten Torres, female    DOB: [birth year], 45 y.o.   MRN: [id number]  HPI [name], a 45 year old female that presents accompanied by husband to establish care. Patient primarily speaks Arabic, using video interpreter to asssist with communication. [name] is complaining of chest pain over the past several months.Chest pain has been intermittent. She describes pain as aching in nature. She also states that it is difficult to breath when lying flat.  Associated symptoms are chest pain and fatigue. Aggravating factors are large meals and lying flat.  . Patient's cardiac risk factors are obesity (BMI >= 30 kg/m2). Patient has not had cardiac testing. She denies dizziness, shortness of breath, fever, or lower extremity edema.  Patient is also complaining of heartburn. This has been associated with abdominal bloating, belching, heartburn and shortness of breath.  She denies bilious reflux, choking on food, cough, difficulty swallowing, dysphagia, early satiety, fullness after meals, hoarseness, melena and midespigastric pain. Symptoms have been present for several months. She denies dysphagia.  She has not lost weight. She denies melena, hematochezia, hematemesis, and coffee ground emesis. Medical therapy in the past has included Tums.  Patient is also complaining of constipation.  Stool pattern has been 2 formed stools per week.  Defecation has been difficult. Co-Morbid conditions include :obesity. Symptoms have been intermittent. Patient states that she does not drink much water and generally eats 2 large meals per day. She has not attempted any OTC interventions to alleviate current symptoms.   Past Medical History  Diagnosis Date  . GERD (gastroesophageal reflux disease)   . Genital mutilation, female   . SVD (spontaneous vaginal delivery) 05/28/2011  No Known Allergies  Immunization History  Administered Date(s) Administered  . Tdap 05/29/2011   Social  History   Social History  . Marital Status: Married    Spouse Name: N/A  . Number of Children: 2  . Years of Education: master's   Occupational History  . homemaker    Social History Main Topics  . Smoking status: Never Smoker   . Smokeless tobacco: Never Used  . Alcohol Use: No  . Drug Use: No  . Sexual Activity:    Partners: Male    Birth Control/ Protection: None     Comment: stopped COC to become pregnant   Other Topics Concern  . Not on file   Social History Narrative   From [place]. Came to the [place] in 2011. Master's Degree in [employer].   Lives with her husband (came here from [place] in 2004) and their 2 children.   Review of Systems  Constitutional: Positive for fatigue.  HENT: Negative.   Eyes: Negative.  Negative for photophobia and visual disturbance.  Respiratory: Positive for shortness of breath (occasionally with lying down). Negative for chest tightness.   Cardiovascular: Positive for chest pain. Negative for palpitations and leg swelling.  Gastrointestinal: Positive for abdominal pain and constipation.  Endocrine: Negative for polydipsia, polyphagia and polyuria.  Genitourinary: Negative for dysuria.  Musculoskeletal: Negative.   Skin: Negative.   Allergic/Immunologic: Negative.   Neurological: Negative.   Hematological: Negative.   Psychiatric/Behavioral: Negative.        Objective:   Physical Exam  Constitutional: She is oriented to person, place, and time. She appears well-developed and well-nourished.  HENT:  Head: Normocephalic and atraumatic.  Right Ear: External ear normal.  Left Ear: External ear normal.  Mouth/Throat: Oropharynx is clear and moist.  Eyes: Conjunctivae  and EOM are normal. Pupils are equal, round, and reactive to light.  Neck: Normal range of motion. Neck supple.  Cardiovascular: Normal rate, regular rhythm, normal heart sounds and intact distal pulses.   Pulmonary/Chest: Effort normal and breath sounds normal. She  exhibits tenderness.    Abdominal: Soft. Bowel sounds are normal. There is tenderness.  Musculoskeletal: Normal range of motion.  Neurological: She is alert and oriented to person, place, and time. She has normal reflexes.  Skin: Skin is warm and dry.  Psychiatric: She has a normal mood and affect. Her behavior is normal. Judgment and thought content normal.         BP 103/66 mmHg  Pulse 101  Temp(Src) 99.2 F (37.3 C) (Oral)  Resp 14  Ht 5\' 5"  (1.651 m)  Wt 199 lb (90.266 kg)  BMI 33.12 kg/m2  LMP 11/09/2015 Assessment & Plan:  1. Chest pain, unspecified chest pain type Patient is complaining of periodic chest pain. Pain is non specific. She denies a family history of hypertension or heart disease. EKG is within a normal range. She reports chest wall tenderness. Recommend Tylenol 500 mg every 6 hours as needed for chest wall tenderness.  - EKG 12-Lead  2. Gastroesophageal reflux disease without esophagitis Will start a 6 week trial of Omeprazole for acid reflux. Given written information on foods to avoid with GERD.  - omeprazole (PRILOSEC) 20 MG capsule; Take 1 capsule (20 mg total) by mouth daily.  Dispense: 42 capsule; Refill: 0  3. Other fatigue - CBC with Differential - TSH  4. Obesity Recommend a lowfat, low carbohydrate diet divided over 5-6 small meals, increase water intake to 6-8 glasses, and 150 minutes per week of cardiovascular exercise.   - Hemoglobin A1c - COMPLETE METABOLIC PANEL WITH GFR  5. Constipation, unspecified constipation type Increase fruits and vegetables. Also, increase water intake.  - polyethylene glycol powder (GLYCOLAX/MIRALAX) powder; Take 17 g by mouth 2 (two) times daily as needed.  Dispense: 3350 g; Refill: 0  6. Breast cancer screening - MM Digital Screening; Future   Routine Health Maintenance:  Recommend pap smear every 3 years Recommend routine screening mammogram Recommend occult blood cards   RTC: 6 weeks for GERD and  obesity. Will follow up by phone with laboratory results.     Aadhya Bustamante M, FNP    The patient was given clear instructions to go to ER or return to medical center if symptoms do not improve, worsen or new problems develop. The patient verbalized understanding. Will notify patient with laboratory results.

## 2015-11-16 NOTE — Patient Instructions (Signed)
Will notify patient by phone with laboratory results.  Will start a trial of omeprazole daily for symptoms of GERD Reviewed EKG, within normal limits    Nonspecific Chest Pain  Chest pain can be caused by many different conditions. There is always a chance that your pain could be related to something serious, such as a heart attack or a blood clot in your lungs. Chest pain can also be caused by conditions that are not life-threatening. If you have chest pain, it is very important to follow up with your health care provider. CAUSES  Chest pain can be caused by:  Heartburn.  Pneumonia or bronchitis.  Anxiety or stress.  Inflammation around your heart (pericarditis) or lung (pleuritis or pleurisy).  A blood clot in your lung.  A collapsed lung (pneumothorax). It can develop suddenly on its own (spontaneous pneumothorax) or from trauma to the chest.  Shingles infection (varicella-zoster virus).  Heart attack.  Damage to the bones, muscles, and cartilage that make up your chest wall. This can include:  Bruised bones due to injury.  Strained muscles or cartilage due to frequent or repeated coughing or overwork.  Fracture to one or more ribs.  Sore cartilage due to inflammation (costochondritis). RISK FACTORS  Risk factors for chest pain may include:  Activities that increase your risk for trauma or injury to your chest.  Respiratory infections or conditions that cause frequent coughing.  Medical conditions or overeating that can cause heartburn.  Heart disease or family history of heart disease.  Conditions or health behaviors that increase your risk of developing a blood clot.  Having had chicken pox (varicella zoster). SIGNS AND SYMPTOMS Chest pain can feel like:  Burning or tingling on the surface of your chest or deep in your chest.  Crushing, pressure, aching, or squeezing pain.  Dull or sharp pain that is worse when you move, cough, or take a deep  breath.  Pain that is also felt in your back, neck, shoulder, or arm, or pain that spreads to any of these areas. Your chest pain may come and go, or it may stay constant. DIAGNOSIS Lab tests or other studies may be needed to find the cause of your pain. Your health care provider may have you take a test called an ambulatory ECG (electrocardiogram). An ECG records your heartbeat patterns at the time the test is performed. You may also have other tests, such as:  Transthoracic echocardiogram (TTE). During echocardiography, sound waves are used to create a picture of all of the heart structures and to look at how blood flows through your heart.  Transesophageal echocardiogram (TEE).This is a more advanced imaging test that obtains images from inside your body. It allows your health care provider to see your heart in finer detail.  Cardiac monitoring. This allows your health care provider to monitor your heart rate and rhythm in real time.  Holter monitor. This is a portable device that records your heartbeat and can help to diagnose abnormal heartbeats. It allows your health care provider to track your heart activity for several days, if needed.  Stress tests. These can be done through exercise or by taking medicine that makes your heart beat more quickly.  Blood tests.  Imaging tests. TREATMENT  Your treatment depends on what is causing your chest pain. Treatment may include:  Medicines. These may include:  Acid blockers for heartburn.  Anti-inflammatory medicine.  Pain medicine for inflammatory conditions.  Antibiotic medicine, if an infection is present.  Medicines to  dissolve blood clots.  Medicines to treat coronary artery disease.  Supportive care for conditions that do not require medicines. This may include:  Resting.  Applying heat or cold packs to injured areas.  Limiting activities until pain decreases. HOME CARE INSTRUCTIONS  If you were prescribed an  antibiotic medicine, finish it all even if you start to feel better.  Avoid any activities that bring on chest pain.  Do not use any tobacco products, including cigarettes, chewing tobacco, or electronic cigarettes. If you need help quitting, ask your health care provider.  Do not drink alcohol.  Take medicines only as directed by your health care provider.  Keep all follow-up visits as directed by your health care provider. This is important. This includes any further testing if your chest pain does not go away.  If heartburn is the cause for your chest pain, you may be told to keep your head raised (elevated) while sleeping. This reduces the chance that acid will go from your stomach into your esophagus.  Make lifestyle changes as directed by your health care provider. These may include:  Getting regular exercise. Ask your health care provider to suggest some activities that are safe for you.  Eating a heart-healthy diet. A registered dietitian can help you to learn healthy eating options.  Maintaining a healthy weight.  Managing diabetes, if necessary.  Reducing stress. SEEK MEDICAL CARE IF:  Your chest pain does not go away after treatment.  You have a rash with blisters on your chest.  You have a fever. SEEK IMMEDIATE MEDICAL CARE IF:   Your chest pain is worse.  You have an increasing cough, or you cough up blood.  You have severe abdominal pain.  You have severe weakness.  You faint.  You have chills.  You have sudden, unexplained chest discomfort.  You have sudden, unexplained discomfort in your arms, back, neck, or jaw.  You have shortness of breath at any time.  You suddenly start to sweat, or your skin gets clammy.  You feel nauseous or you vomit.  You suddenly feel light-headed or dizzy.  Your heart begins to beat quickly, or it feels like it is skipping beats. These symptoms may represent a serious problem that is an emergency. Do not wait to  see if the symptoms will go away. Get medical help right away. Call your local emergency services (911 in the [place] Do not drive yourself to the hospital.   This information is not intended to replace advice given to you by your health care provider. Make sure you discuss any questions you have with your health care provider.   Document Released: 06/08/2005 Document Revised: 09/19/2014 Document Reviewed: 04/04/2014 Elsevier Interactive Patient Education 2016 [place] for Gastroesophageal Reflux Disease, Adult When you have gastroesophageal reflux disease (GERD), the foods you eat and your eating habits are very important. Choosing the right foods can help ease the discomfort of GERD. WHAT GENERAL GUIDELINES DO I NEED TO FOLLOW?  Choose fruits, vegetables, whole grains, low-fat dairy products, and low-fat meat, fish, and poultry.  Limit fats such as oils, salad dressings, butter, nuts, and avocado.  Keep a food diary to identify foods that cause symptoms.  Avoid foods that cause reflux. These may be different for different people.  Eat frequent small meals instead of three large meals each day.  Eat your meals slowly, in a relaxed setting.  Limit fried foods.  Cook foods using methods other than frying.  Avoid drinking alcohol.  Avoid drinking large amounts of liquids with your meals.  Avoid bending over or lying down until 2-3 hours after eating. WHAT FOODS ARE NOT RECOMMENDED? The following are some foods and drinks that may worsen your symptoms: Vegetables Tomatoes. Tomato juice. Tomato and spaghetti sauce. Chili peppers. Onion and garlic. Horseradish. Fruits Oranges, grapefruit, and lemon (fruit and juice). Meats High-fat meats, fish, and poultry. This includes hot dogs, ribs, ham, sausage, salami, and bacon. Dairy Whole milk and chocolate milk. Sour cream. Cream. Butter. Ice cream. Cream cheese.  Beverages Coffee and tea, with or without caffeine.  Carbonated beverages or energy drinks. Condiments Hot sauce. Barbecue sauce.  Sweets/Desserts Chocolate and cocoa. Donuts. Peppermint and spearmint. Fats and Oils High-fat foods, including [place] fries and potato chips. Other Vinegar. Strong spices, such as black pepper, white pepper, red pepper, cayenne, curry powder, cloves, ginger, and chili powder. The items listed above may not be a complete list of foods and beverages to avoid. Contact your dietitian for more information.   This information is not intended to replace advice given to you by your health care provider. Make sure you discuss any questions you have with your health care provider.   Document Released: 08/29/2005 Document Revised: 09/19/2014 Document Reviewed: 07/03/2013 Elsevier Interactive Patient Education 2016 [place]. Gastroesophageal Reflux Disease, Adult Normally, food travels down the esophagus and stays in the stomach to be digested. However, when a person has gastroesophageal reflux disease (GERD), food and stomach acid move back up into the esophagus. When this happens, the esophagus becomes sore and inflamed. Over time, GERD can create small holes (ulcers) in the lining of the esophagus.  CAUSES This condition is caused by a problem with the muscle between the esophagus and the stomach (lower esophageal sphincter, or LES). Normally, the LES muscle closes after food passes through the esophagus to the stomach. When the LES is weakened or abnormal, it does not close properly, and that allows food and stomach acid to go back up into the esophagus. The LES can be weakened by certain dietary substances, medicines, and medical conditions, including:  Tobacco use.  Pregnancy.  Having a hiatal hernia.  Heavy alcohol use.  Certain foods and beverages, such as coffee, chocolate, onions, and peppermint. RISK FACTORS This condition is more likely to develop in:  People who have an increased body weight.  People  who have connective tissue disorders.  People who use NSAID medicines. SYMPTOMS Symptoms of this condition include:  Heartburn.  Difficult or painful swallowing.  The feeling of having a lump in the throat.  Abitter taste in the mouth.  Bad breath.  Having a large amount of saliva.  Having an upset or bloated stomach.  Belching.  Chest pain.  Shortness of breath or wheezing.  Ongoing (chronic) cough or a night-time cough.  Wearing away of tooth enamel.  Weight loss. Different conditions can cause chest pain. Make sure to see your health care provider if you experience chest pain. DIAGNOSIS Your health care provider will take a medical history and perform a physical exam. To determine if you have mild or severe GERD, your health care provider may also monitor how you respond to treatment. You may also have other tests, including:  An endoscopy toexamine your stomach and esophagus with a small camera.  A test thatmeasures the acidity level in your esophagus.  A test thatmeasures how much pressure is on your esophagus.  A barium swallow or modified barium swallow to show the shape, size, and functioning  of your esophagus. TREATMENT The goal of treatment is to help relieve your symptoms and to prevent complications. Treatment for this condition may vary depending on how severe your symptoms are. Your health care provider may recommend:  Changes to your diet.  Medicine.  Surgery. HOME CARE INSTRUCTIONS Diet  Follow a diet as recommended by your health care provider. This may involve avoiding foods and drinks such as:  Coffee and tea (with or without caffeine).  Drinks that containalcohol.  Energy drinks and sports drinks.  Carbonated drinks or sodas.  Chocolate and cocoa.  Peppermint and mint flavorings.  Garlic and onions.  Horseradish.  Spicy and acidic foods, including peppers, chili powder, curry powder, vinegar, hot sauces, and barbecue  sauce.  Citrus fruit juices and citrus fruits, such as oranges, lemons, and limes.  Tomato-based foods, such as red sauce, chili, salsa, and pizza with red sauce.  Fried and fatty foods, such as donuts, french fries, potato chips, and high-fat dressings.  High-fat meats, such as hot dogs and fatty cuts of red and white meats, such as rib eye steak, sausage, ham, and bacon.  High-fat dairy items, such as whole milk, butter, and cream cheese.  Eat small, frequent meals instead of large meals.  Avoid drinking large amounts of liquid with your meals.  Avoid eating meals during the 2-3 hours before bedtime.  Avoid lying down right after you eat.  Do not exercise right after you eat. General Instructions  Pay attention to any changes in your symptoms.  Take over-the-counter and prescription medicines only as told by your health care provider. Do not take aspirin, ibuprofen, or other NSAIDs unless your health care provider told you to do so.  Do not use any tobacco products, including cigarettes, chewing tobacco, and e-cigarettes. If you need help quitting, ask your health care provider.  Wear loose-fitting clothing. Do not wear anything tight around your waist that causes pressure on your abdomen.  Raise (elevate) the head of your bed 6 inches (15cm).  Try to reduce your stress, such as with yoga or meditation. If you need help reducing stress, ask your health care provider.  If you are overweight, reduce your weight to an amount that is healthy for you. Ask your health care provider for guidance about a safe weight loss goal.  Keep all follow-up visits as told by your health care provider. This is important. SEEK MEDICAL CARE IF:  You have new symptoms.  You have unexplained weight loss.  You have difficulty swallowing, or it hurts to swallow.  You have wheezing or a persistent cough.  Your symptoms do not improve with treatment.  You have a hoarse voice. SEEK  IMMEDIATE MEDICAL CARE IF:  You have pain in your arms, neck, jaw, teeth, or back.  You feel sweaty, dizzy, or light-headed.  You have chest pain or shortness of breath.  You vomit and your vomit looks like blood or coffee grounds.  You faint.  Your stool is bloody or black.  You cannot swallow, drink, or eat.   This information is not intended to replace advice given to you by your health care provider. Make sure you discuss any questions you have with your health care provider.   Document Released: 06/08/2005 Document Revised: 05/20/2015 Document Reviewed: 12/24/2014 Elsevier Interactive Patient Education [employer].

## 2015-11-18 ENCOUNTER — Other Ambulatory Visit: Payer: Self-pay | Admitting: Family Medicine

## 2015-11-18 DIAGNOSIS — Z1231 Encounter for screening mammogram for malignant neoplasm of breast: Secondary | ICD-10-CM

## 2015-12-07 ENCOUNTER — Ambulatory Visit
Admission: RE | Admit: 2015-12-07 | Discharge: 2015-12-07 | Disposition: A | Discharge status: Home / Self Care | Payer: No Typology Code available for payment source | Source: Ambulatory Visit | Attending: Family Medicine | Admitting: Family Medicine

## 2015-12-07 DIAGNOSIS — Z1231 Encounter for screening mammogram for malignant neoplasm of breast: Secondary | ICD-10-CM

## 2015-12-28 ENCOUNTER — Encounter: Payer: Self-pay | Admitting: Family Medicine

## 2015-12-28 ENCOUNTER — Other Ambulatory Visit: Payer: Self-pay | Admitting: Family Medicine

## 2015-12-28 ENCOUNTER — Ambulatory Visit (INDEPENDENT_AMBULATORY_CARE_PROVIDER_SITE_OTHER): Payer: No Typology Code available for payment source | Admitting: Family Medicine

## 2015-12-28 VITALS — BP 118/73 | HR 82 | Temp 98.3°F | Ht 65.0 in | Wt 192.0 lb

## 2015-12-28 DIAGNOSIS — K5900 Constipation, unspecified: Secondary | ICD-10-CM

## 2015-12-28 DIAGNOSIS — M542 Cervicalgia: Secondary | ICD-10-CM

## 2015-12-28 DIAGNOSIS — M25511 Pain in right shoulder: Secondary | ICD-10-CM

## 2015-12-28 DIAGNOSIS — G8929 Other chronic pain: Secondary | ICD-10-CM

## 2015-12-28 MED ORDER — POLYETHYLENE GLYCOL 3350 17 GM/SCOOP PO POWD: 17.0000 g | Freq: Every day | ORAL | Status: DC

## 2015-12-28 MED ORDER — MELOXICAM 15 MG PO TABS: 15.0000 mg | ORAL_TABLET | Freq: Every day | ORAL | Status: DC

## 2015-12-28 MED FILL — MELOXICAM 15 MG TABLET: 15 | 30 days supply | Qty: 30 | Fill #0

## 2015-12-28 MED FILL — POLYETHYLENE GLYCOL 3350: 30 days supply | Qty: 510 | Fill #0

## 2015-12-28 NOTE — Progress Notes (Signed)
Subjective:    Patient ID: Kirsten Torres, female    DOB: 11-Jan-1971, 45 y.o.   MRN: [id number]  HPI Ms. Vernecia Lazer, a 45 year old female that presents accompanied by husband for a 1 month follow up of right shoulder and neck pain. Patient reports that pain to right shoulder and neck has been increased for greater than 6 months. The pain is described as aching and throbbing.   The pain occurs continuously.ymptoms are aggravated by lifting heavy objects. Pain intensity is 6/10.  Symptoms are diminished by rest.Shoulder stiffness and weakness is reported. She denies recent injury.    Patient is also complaining of constipation.  Stool pattern has been 2-3  formed stools per week.  Defecation has been difficult. Co-Morbid conditions include :obesity. Symptoms have been intermittent. Patient states that she does not drink much water and generally eats 2 large meals per day. She has not attempted any OTC interventions to alleviate current symptoms.   Past Medical History  Diagnosis Date  . GERD (gastroesophageal reflux disease)   . Genital mutilation, female   . SVD (spontaneous vaginal delivery) 05/28/2011  No Known Allergies  Immunization History  Administered Date(s) Administered  . Tdap 05/29/2011   Social History   Social History  . Marital Status: Married    Spouse Name: N/A  . Number of Children: 2  . Years of Education: master's   Occupational History  . homemaker    Social History Main Topics  . Smoking status: Never Smoker   . Smokeless tobacco: Never Used  . Alcohol Use: No  . Drug Use: No  . Sexual Activity:    Partners: Male    Birth Control/ Protection: None     Comment: stopped COC to become pregnant   Other Topics Concern  . Not on file   Social History Narrative   From [place]. Came to the [place] in 2011. Master's Degree in [employer].   Lives with her husband (came here from [place] in 2004) and their 2 children.   Review of Systems  Constitutional:  Negative.   HENT: Negative.   Eyes: Negative.  Negative for photophobia and visual disturbance.  Respiratory: Negative for chest tightness.   Cardiovascular: Negative for palpitations and leg swelling.  Gastrointestinal: Positive for constipation.  Endocrine: Negative for polydipsia, polyphagia and polyuria.  Genitourinary: Negative for dysuria.  Musculoskeletal: Positive for myalgias.  Skin: Negative.   Allergic/Immunologic: Negative.   Neurological: Negative.   Hematological: Negative.   Psychiatric/Behavioral: Negative.        Objective:   Physical Exam  Constitutional: She is oriented to person, place, and time. She appears well-developed and well-nourished.  HENT:  Head: Normocephalic and atraumatic.  Right Ear: External ear normal.  Left Ear: External ear normal.  Mouth/Throat: Oropharynx is clear and moist.  Eyes: Conjunctivae and EOM are normal. Pupils are equal, round, and reactive to light.  Neck: Neck supple. Muscular tenderness present. No spinous process tenderness present. Decreased range of motion present.  Cardiovascular: Normal rate, regular rhythm, normal heart sounds and intact distal pulses.   Pulmonary/Chest: Effort normal and breath sounds normal.  Abdominal: Soft. Bowel sounds are normal. There is tenderness.  Musculoskeletal:       Right shoulder: She exhibits decreased range of motion, tenderness, pain and decreased strength. She exhibits no swelling and no spasm.  Neurological: She is alert and oriented to person, place, and time. She has normal reflexes.  Skin: Skin is warm and dry.  Psychiatric:  She has a normal mood and affect. Her behavior is normal. Judgment and thought content normal.         BP 118/73 mmHg  Pulse 82  Temp(Src) 98.3 F (36.8 C) (Oral)  Ht 5\' 5"  (1.651 m)  Wt 192 lb (87.091 kg)  BMI 31.95 kg/m2  SpO2 100% Assessment & Plan:  1. Constipation, unspecified constipation type Recommend increase water intake to 6-8 glasses  per day, increase fiber intake and daily exercise.  - polyethylene glycol powder (GLYCOLAX/MIRALAX) powder; Take 17 g by mouth daily.  Dispense: 3350 g; Refill: 1  2. Chronic right shoulder pain Will start a trial of Meloxicam. Will also xray the cervical spine. I will likely send a referral to sports medicine for further evaluation.  Refrain from lifting objects greater than 20 pounds.  - meloxicam (MOBIC) 15 MG tablet; Take 1 tablet (15 mg total) by mouth daily.  Dispense: 30 tablet; Refill: 0 - DG Cervical Spine Complete; Future  3. Cervicalgia Recommend that patient use heat therapy interchangeably with cold packs for 20 minutes 4 times per day.  - DG Cervical Spine Complete; Future  Routine Health Maintenance:  Recommend pap smear every 3 years Recommend routine screening mammogram Recommend occult blood cards   RTC: Schedule follow up after xray is complete.      Lutie Pickler M, FNP    The patient was given clear instructions to go to ER or return to medical center if symptoms do not improve, worsen or new problems develop. The patient verbalized understanding. Will notify patient with laboratory results.

## 2015-12-28 NOTE — Patient Instructions (Addendum)
Shoulder Pain The shoulder is the joint that connects your arms to your body. The bones that form the shoulder joint include the upper arm bone (humerus), the shoulder blade (scapula), and the collarbone (clavicle). The top of the humerus is shaped like a ball and fits into a rather flat socket on the scapula (glenoid cavity). A combination of muscles and strong, fibrous tissues that connect muscles to bones (tendons) support your shoulder joint and hold the ball in the socket. Small, fluid-filled sacs (bursae) are located in different areas of the joint. They act as cushions between the bones and the overlying soft tissues and help reduce friction between the gliding tendons and the bone as you move your arm. Your shoulder joint allows a wide range of motion in your arm. This range of motion allows you to do things like scratch your back or throw a ball. However, this range of motion also makes your shoulder more prone to pain from overuse and injury. Causes of shoulder pain can originate from both injury and overuse and usually can be grouped in the following four categories:  Redness, swelling, and pain (inflammation) of the tendon (tendinitis) or the bursae (bursitis).  Instability, such as a dislocation of the joint.  Inflammation of the joint (arthritis).  Broken bone (fracture). HOME CARE INSTRUCTIONS   Apply ice to the sore area.  Put ice in a plastic bag.  Place a towel between your skin and the bag.  Leave the ice on for 15-20 minutes, 3-4 times per day for the first 2 days, or as directed by your health care provider.  Stop using cold packs if they do not help with the pain.  If you have a shoulder sling or immobilizer, wear it as long as your caregiver instructs. Only remove it to shower or bathe. Move your arm as little as possible, but keep your hand moving to prevent swelling.  Squeeze a soft ball or foam pad as much as possible to help prevent swelling.  Only take  over-the-counter or prescription medicines for pain, discomfort, or fever as directed by your caregiver. SEEK MEDICAL CARE IF:   Your shoulder pain increases, or new pain develops in your arm, hand, or fingers.  Your hand or fingers become cold and numb.  Your pain is not relieved with medicines. SEEK IMMEDIATE MEDICAL CARE IF:   Your arm, hand, or fingers are numb or tingling.  Your arm, hand, or fingers are significantly swollen or turn white or blue. MAKE SURE YOU:   Understand these instructions.  Will watch your condition.  Will get help right away if you are not doing well or get worse.   This information is not intended to replace advice given to you by your health care provider. Make sure you discuss any questions you have with your health care provider.   Document Released: 06/08/2005 Document Revised: 09/19/2014 Document Reviewed: 12/22/2014 Elsevier Interactive Patient Education 2016 [place] therapy can help ease sore, stiff, injured, and tight muscles and joints. Heat relaxes your muscles, which may help ease your pain.  RISKS AND COMPLICATIONS If you have any of the following conditions, do not use heat therapy unless your health care provider has approved:  Poor circulation.  Healing wounds or scarred skin in the area being treated.  Diabetes, heart disease, or high blood pressure.  Not being able to feel (numbness) the area being treated.  Unusual swelling of the area being treated.  Active infections.  Blood  clots.  Cancer.  Inability to communicate pain. This may include young children and people who have problems with their brain function (dementia).  Pregnancy. Heat therapy should only be used on old, pre-existing, or long-lasting (chronic) injuries. Do not use heat therapy on new injuries unless directed by your health care provider. HOW TO USE HEAT THERAPY There are several different kinds of heat therapy,  including:  Moist heat pack.  Warm water bath.  Hot water bottle.  Electric heating pad.  Heated gel pack.  Heated wrap.  Electric heating pad. Use the heat therapy method suggested by your health care provider. Follow your health care provider's instructions on when and how to use heat therapy. GENERAL HEAT THERAPY RECOMMENDATIONS  Do not sleep while using heat therapy. Only use heat therapy while you are awake.  Your skin may turn pink while using heat therapy. Do not use heat therapy if your skin turns red.  Do not use heat therapy if you have new pain.  High heat or long exposure to heat can cause burns. Be careful when using heat therapy to avoid burning your skin.  Do not use heat therapy on areas of your skin that are already irritated, such as with a rash or sunburn. SEEK MEDICAL CARE IF:  You have blisters, redness, swelling, or numbness.  You have new pain.  Your pain is worse. MAKE SURE YOU:  Understand these instructions.  Will watch your condition.  Will get help right away if you are not doing well or get worse.   This information is not intended to replace advice given to you by your health care provider. Make sure you discuss any questions you have with your health care provider.   Document Released: 11/21/2011 Document Revised: 09/19/2014 Document Reviewed: 10/22/2013 Elsevier Interactive Patient Education 2016 [employer]. Constipation, Adult Constipation is when a person has fewer than three bowel movements a week, has difficulty having a bowel movement, or has stools that are dry, hard, or larger than normal. As people grow older, constipation is more common. A low-fiber diet, not taking in enough fluids, and taking certain medicines may make constipation worse.  CAUSES   Certain medicines, such as antidepressants, pain medicine, iron supplements, antacids, and water pills.   Certain diseases, such as diabetes, irritable bowel syndrome (IBS),  thyroid disease, or depression.   Not drinking enough water.   Not eating enough fiber-rich foods.   Stress or travel.   Lack of physical activity or exercise.   Ignoring the urge to have a bowel movement.   Using laxatives too much.  SIGNS AND SYMPTOMS   Having fewer than three bowel movements a week.   Straining to have a bowel movement.   Having stools that are hard, dry, or larger than normal.   Feeling full or bloated.   Pain in the lower abdomen.   Not feeling relief after having a bowel movement.  DIAGNOSIS  Your health care provider will take a medical history and perform a physical exam. Further testing may be done for severe constipation. Some tests may include:  A barium enema X-ray to examine your rectum, colon, and, sometimes, your small intestine.   A sigmoidoscopy to examine your lower colon.   A colonoscopy to examine your entire colon. TREATMENT  Treatment will depend on the severity of your constipation and what is causing it. Some dietary treatments include drinking more fluids and eating more fiber-rich foods. Lifestyle treatments may include regular exercise. If these diet  and lifestyle recommendations do not help, your health care provider may recommend taking over-the-counter laxative medicines to help you have bowel movements. Prescription medicines may be prescribed if over-the-counter medicines do not work.  HOME CARE INSTRUCTIONS   Eat foods that have a lot of fiber, such as fruits, vegetables, whole grains, and beans.  Limit foods high in fat and processed sugars, such as french fries, hamburgers, cookies, candies, and soda.   A fiber supplement may be added to your diet if you cannot get enough fiber from foods.   Drink enough fluids to keep your urine clear or pale yellow.   Exercise regularly or as directed by your health care provider.   Go to the restroom when you have the urge to go. Do not hold it.   Only take  over-the-counter or prescription medicines as directed by your health care provider. Do not take other medicines for constipation without talking to your health care provider first.  [place] IF:   You have bright red blood in your stool.   Your constipation lasts for more than 4 days or gets worse.   You have abdominal or rectal pain.   You have thin, pencil-like stools.   You have unexplained weight loss. MAKE SURE YOU:   Understand these instructions.  Will watch your condition.  Will get help right away if you are not doing well or get worse.   This information is not intended to replace advice given to you by your health care provider. Make sure you discuss any questions you have with your health care provider.   Document Released: 05/27/2004 Document Revised: 09/19/2014 Document Reviewed: 06/10/2013 Elsevier Interactive Patient Education [employer].

## 2015-12-29 MED FILL — OMEPRAZOLE DR 20 MG CAPSULE: 20 | 42 days supply | Qty: 42 | Fill #0

## 2016-02-23 MED FILL — ?OMEPRAZOLE DR 20 MG CAPSUL: 20 | 30 days supply | Qty: 30 | Fill #1

## 2016-03-09 ENCOUNTER — Other Ambulatory Visit: Payer: Self-pay | Admitting: Family Medicine

## 2016-03-09 MED FILL — POLYETHYLENE GLYCOL 3350: 30 days supply | Qty: 510 | Fill #1

## 2016-03-16 ENCOUNTER — Ambulatory Visit: Payer: Self-pay | Attending: Internal Medicine

## 2016-03-29 ENCOUNTER — Telehealth: Payer: Self-pay

## 2016-03-29 NOTE — Telephone Encounter (Signed)
Called and left message. Patient needs xray done before we see about referring to Sports medicine per [place]'s note. Thanks!

## 2016-03-29 NOTE — Telephone Encounter (Signed)
Pt is requesting a return call with information about referral. Thanks!

## 2016-04-07 ENCOUNTER — Ambulatory Visit (HOSPITAL_COMMUNITY)
Admission: RE | Admit: 2016-04-07 | Discharge: 2016-04-07 | Disposition: A | Discharge status: Home / Self Care | Payer: Self-pay | Source: Ambulatory Visit | Attending: Family Medicine | Admitting: Family Medicine

## 2016-04-07 DIAGNOSIS — M50322 Other cervical disc degeneration at C5-C6 level: Secondary | ICD-10-CM | POA: Insufficient documentation

## 2016-04-07 DIAGNOSIS — M25511 Pain in right shoulder: Secondary | ICD-10-CM | POA: Insufficient documentation

## 2016-04-07 DIAGNOSIS — M542 Cervicalgia: Secondary | ICD-10-CM | POA: Insufficient documentation

## 2016-04-07 DIAGNOSIS — G8929 Other chronic pain: Secondary | ICD-10-CM | POA: Insufficient documentation

## 2016-06-02 ENCOUNTER — Telehealth: Payer: Self-pay

## 2016-06-02 NOTE — Telephone Encounter (Signed)
Call placed to patient to advised of normal xray. Patient still complains of pain and numbness appointment is being set now for patient to come in for follow up. Thanks!

## 2016-06-21 ENCOUNTER — Ambulatory Visit (INDEPENDENT_AMBULATORY_CARE_PROVIDER_SITE_OTHER): Payer: No Typology Code available for payment source | Admitting: Family Medicine

## 2016-06-21 ENCOUNTER — Encounter: Payer: Self-pay | Admitting: Family Medicine

## 2016-06-21 VITALS — BP 104/70 | HR 77 | Temp 98.7°F | Resp 16 | Ht 65.0 in | Wt 173.0 lb

## 2016-06-21 DIAGNOSIS — Z0000 Encounter for general adult medical examination without abnormal findings: Secondary | ICD-10-CM

## 2016-06-21 DIAGNOSIS — K5900 Constipation, unspecified: Secondary | ICD-10-CM

## 2016-06-21 DIAGNOSIS — M25519 Pain in unspecified shoulder: Secondary | ICD-10-CM

## 2016-06-21 LAB — CBC WITH DIFFERENTIAL/PLATELET
BASOS ABS: 0 {cells}/uL (ref 0–200)
BASOS PCT: 0 %
EOS ABS: 88 {cells}/uL (ref 15–500)
Eosinophils Relative: 2 %
HEMATOCRIT: 36.8 % (ref 35.0–45.0)
Hemoglobin: 11.7 g/dL (ref 11.7–15.5)
LYMPHS PCT: 43 %
Lymphs Abs: 1892 cells/uL (ref 850–3900)
MCH: 27.5 pg (ref 27.0–33.0)
MCHC: 31.8 g/dL — ABNORMAL LOW (ref 32.0–36.0)
MCV: 86.6 fL (ref 80.0–100.0)
MONO ABS: 440 {cells}/uL (ref 200–950)
MPV: 9.3 fL (ref 7.5–12.5)
Monocytes Relative: 10 %
NEUTROS PCT: 45 %
Neutro Abs: 1980 cells/uL (ref 1500–7800)
Platelets: 218 10*3/uL (ref 140–400)
RBC: 4.25 MIL/uL (ref 3.80–5.10)
RDW: 13.6 % (ref 11.0–15.0)
WBC: 4.4 10*3/uL (ref 3.8–10.8)

## 2016-06-21 LAB — LIPID PANEL
Cholesterol: 133 mg/dL (ref 125–200)
HDL: 47 mg/dL (ref >=46)
LDL CALC: 74 mg/dL (ref <130)
Total CHOL/HDL Ratio: 2.8 Ratio (ref <=5.0)
Triglycerides: 60 mg/dL (ref <150)
VLDL: 12 mg/dL (ref <30)

## 2016-06-21 LAB — COMPLETE METABOLIC PANEL WITH GFR
ALT: 5 U/L — AB (ref 6–29)
AST: 10 U/L (ref 10–30)
Albumin: 3.9 g/dL (ref 3.6–5.1)
Alkaline Phosphatase: 50 U/L (ref 33–115)
BUN: 9 mg/dL (ref 7–25)
CHLORIDE: 103 mmol/L (ref 98–110)
CO2: 28 mmol/L (ref 20–31)
CREATININE: 0.62 mg/dL (ref 0.50–1.10)
Calcium: 8.9 mg/dL (ref 8.6–10.2)
GFR, Est African American: >89 mL/min (ref >=60)
GFR, Est Non African American: >89 mL/min (ref >=60)
GLUCOSE: 84 mg/dL (ref 65–99)
Potassium: 4 mmol/L (ref 3.5–5.3)
Sodium: 138 mmol/L (ref 135–146)
TOTAL PROTEIN: 6.5 g/dL (ref 6.1–8.1)
Total Bilirubin: 0.3 mg/dL (ref 0.2–1.2)

## 2016-06-21 MED ORDER — POLYETHYLENE GLYCOL 3350 17 GM/SCOOP PO POWD: 17.0000 g | Freq: Every day | ORAL | 1 refills | Status: DC

## 2016-06-21 MED ORDER — NAPROXEN SODIUM 220 MG PO TABS: 220.0000 mg | ORAL_TABLET | Freq: Two times a day (BID) | ORAL | 1 refills | Status: DC

## 2016-06-21 MED FILL — POLYETHYLENE GLYCOL 3350: 15 days supply | Qty: 255 | Fill #0

## 2016-06-21 NOTE — Progress Notes (Signed)
Kirsten Torres, is a 45 y.o. female  [id number]  [id number]  DOB - 1971-02-28  CC:  Chief Complaint  Patient presents with  . Constipation    hemorriods and constipation   . Shoulder Pain    right shoulder pain  . Breast Problem    discharge from breast right only        HPI: Kirsten Torres is a 45 y.o. female here for follow-up constipation and neck and shoulder pain, as well as back pain. She found miralax did help her constipation but she ran out. She did not find the Mobic very helpful. She also complains of some discharge from her nipple but she has had a negative mammogram since this started. Her husband acting as interpreter.  Health Maintentance: She declines flu shot today. She reports having a PAP smear in 2016. Will screen for HIV today. Her Tdap is up to date.  No Known Allergies Past Medical History:  Diagnosis Date  . Genital mutilation, female   . GERD (gastroesophageal reflux disease)   . SVD (spontaneous vaginal delivery) 05/28/2011   Current Outpatient Prescriptions on File Prior to Visit  Medication Sig Dispense Refill  . omeprazole (PRILOSEC) 20 MG capsule TAKE 1 CAPSULE BY MOUTH DAILY. (Patient not taking: Reported on 06/21/2016) 42 capsule 0   No current facility-administered medications on file prior to visit.    History reviewed. No pertinent family history. Social History   Social History  . Marital status: Married    Spouse name: N/A  . Number of children: 2  . Years of education: master's   Occupational History  . homemaker    Social History Main Topics  . Smoking status: Never Smoker  . Smokeless tobacco: Never Used  . Alcohol use No  . Drug use: No  . Sexual activity: Yes    Partners: Male    Birth control/ protection: None     Comment: stopped COC to become pregnant   Other Topics Concern  . Not on file   Social History Narrative   From [place]. Came to the [place] in 2011. Master's Degree in [employer].   Lives with her  husband (came here from [place] in 2004) and their 2 children.    Review of Systems: Constitutional: Negative Skin: Negative HENT: Negative  Eyes: Negative  Neck: Negative Respiratory: Negative Cardiovascular: Negative except for occ palpitations.  Gastrointestinal: Negative Genitourinary: Negative  Musculoskeletal: + for neck, left shoulder, and generalized back pain  Neurological: + for some numbness of feet and lower legs.+ for occ headaches that are relieved by Aleve. Hematological: Negative  Psychiatric/Behavioral: Negative    Objective:   Vitals:   06/21/16 1400  BP: 104/70  Pulse: 77  Resp: 16  Temp: 98.7 F (37.1 C)    Physical Exam: Constitutional: Patient appears well-developed and well-nourished. No distress. HENT: Normocephalic, atraumatic, External right and left ear normal. Oropharynx is clear and moist.  Eyes: Conjunctivae and EOM are normal. PERRLA, no scleral icterus. Neck: Normal ROM. Neck supple. No lymphadenopathy, No thyromegaly. CVS: RRR, S1/S2 +, no murmurs, no gallops, no rubs Pulmonary: Effort and breath sounds normal, no stridor, rhonchi, wheezes, rales.  Abdominal: Soft. Normoactive BS,, no distension,no  rebound or guarding. There seems to be tenderness any where I press on her abdomen, greater in the area below the umbilicus. Musculoskeletal: Slight decrease in ROM of shoulder (L). She reports tenderness any where I tap gently on her back, Neuro: Alert.Normal muscle tone coordination. Non-focal Skin: Skin is  warm and dry. No rash noted. Not diaphoretic. No erythema. No pallor. Psychiatric: Normal mood and affect. Behavior, judgment, thought content normal.  Lab Results  Component Value Date   WBC 4.4 06/21/2016   HGB 11.7 06/21/2016   HCT 36.8 06/21/2016   MCV 86.6 06/21/2016   PLT 218 06/21/2016   Lab Results  Component Value Date   CREATININE 0.62 06/21/2016   BUN 9 06/21/2016   NA 138 06/21/2016   K 4.0 06/21/2016   CL 103  06/21/2016   CO2 28 06/21/2016    Lab Results  Component Value Date   HGBA1C 5.3 11/16/2015   Lipid Panel     Component Value Date/Time   CHOL 133 06/21/2016 1435   TRIG 60 06/21/2016 1435   HDL 47 06/21/2016 1435   CHOLHDL 2.8 06/21/2016 1435   VLDL 12 06/21/2016 1435   LDLCALC 74 06/21/2016 1435       Assessment and plan:   1. Constipation, unspecified constipation type  - polyethylene glycol powder (GLYCOLAX/MIRALAX) powder; Take 17 g by mouth daily.  Dispense: 3350 g; Refill: 1  2. Healthcare maintenance  - COMPLETE METABOLIC PANEL WITH GFR - CBC with Differential - Lipid panel - HIV antibody (with reflex)  3. Arthralgia of shoulder, unspecified laterality  - Ambulatory referral to Physical Therapy - naproxen sodium (ANAPROX) 220 MG tablet; Take 1 tablet (220 mg total) by mouth 2 (two) times daily with a meal.  Dispense: 60 tablet; Refill: 1   Return in about 6 months (around 12/20/2016).  The patient was given clear instructions to go to ER or return to medical center if symptoms don't improve, worsen or new problems develop. The patient verbalized understanding.    Micheline Chapman FNP  06/22/2016, 8:16 AM

## 2016-06-21 NOTE — Patient Instructions (Signed)
Will put in a order to PT. Someone will call you if you get the referral. Try to sleep with neck in good alignmnent. Do not leg pillow push head forward or fall back or push to far t either side. Range of motion of neck and shoulder may also help.

## 2016-06-22 LAB — HIV ANTIBODY (ROUTINE TESTING W REFLEX): HIV 1&2 Ab, 4th Generation: NONREACTIVE

## 2016-06-22 MED FILL — ?OMEPRAZOLE DR 20 MG CAPSUL: 20 | 12 days supply | Qty: 12 | Fill #2

## 2016-07-18 MED FILL — POLYETHYLENE GLYCOL 3350: 15 days supply | Qty: 255 | Fill #1

## 2016-08-26 ENCOUNTER — Other Ambulatory Visit: Payer: Self-pay | Admitting: Family Medicine

## 2016-08-26 DIAGNOSIS — K5900 Constipation, unspecified: Secondary | ICD-10-CM

## 2016-08-26 MED FILL — POLYETHYLENE GLYCOL 3350: 30 days supply | Qty: 510 | Fill #0

## 2016-12-20 ENCOUNTER — Ambulatory Visit: Payer: No Typology Code available for payment source | Admitting: Family Medicine

## 2016-12-29 ENCOUNTER — Ambulatory Visit: Payer: No Typology Code available for payment source | Admitting: Family Medicine

## 2017-01-19 ENCOUNTER — Encounter: Payer: Self-pay | Admitting: Family Medicine

## 2017-01-19 ENCOUNTER — Ambulatory Visit (INDEPENDENT_AMBULATORY_CARE_PROVIDER_SITE_OTHER): Payer: Self-pay | Admitting: Family Medicine

## 2017-01-19 VITALS — BP 103/66 | HR 98 | Temp 98.8°F | Resp 16 | Ht 65.0 in | Wt 174.0 lb

## 2017-01-19 DIAGNOSIS — R300 Dysuria: Secondary | ICD-10-CM | POA: Insufficient documentation

## 2017-01-19 DIAGNOSIS — R3915 Urgency of urination: Secondary | ICD-10-CM

## 2017-01-19 DIAGNOSIS — K5909 Other constipation: Secondary | ICD-10-CM

## 2017-01-19 DIAGNOSIS — R350 Frequency of micturition: Secondary | ICD-10-CM

## 2017-01-19 DIAGNOSIS — R1084 Generalized abdominal pain: Secondary | ICD-10-CM

## 2017-01-19 LAB — CBC WITH DIFFERENTIAL/PLATELET
BASOS PCT: 0 %
Basophils Absolute: 0 cells/uL (ref 0–200)
EOS ABS: 53 {cells}/uL (ref 15–500)
Eosinophils Relative: 1 %
HCT: 39.3 % (ref 35.0–45.0)
Hemoglobin: 12.7 g/dL (ref 11.7–15.5)
Lymphocytes Relative: 42 %
Lymphs Abs: 2226 cells/uL (ref 850–3900)
MCH: 27.9 pg (ref 27.0–33.0)
MCHC: 32.3 g/dL (ref 32.0–36.0)
MCV: 86.4 fL (ref 80.0–100.0)
MONO ABS: 371 {cells}/uL (ref 200–950)
MONOS PCT: 7 %
MPV: 10.5 fL (ref 7.5–12.5)
NEUTROS ABS: 2650 {cells}/uL (ref 1500–7800)
Neutrophils Relative %: 50 %
PLATELETS: 255 10*3/uL (ref 140–400)
RBC: 4.55 MIL/uL (ref 3.80–5.10)
RDW: 13.4 % (ref 11.0–15.0)
WBC: 5.3 10*3/uL (ref 3.8–10.8)

## 2017-01-19 LAB — COMPLETE METABOLIC PANEL WITH GFR
ALT: 6 U/L (ref 6–29)
AST: 13 U/L (ref 10–30)
Albumin: 4.1 g/dL (ref 3.6–5.1)
Alkaline Phosphatase: 43 U/L (ref 33–115)
BILIRUBIN TOTAL: 0.4 mg/dL (ref 0.2–1.2)
BUN: 11 mg/dL (ref 7–25)
CALCIUM: 9.2 mg/dL (ref 8.6–10.2)
CHLORIDE: 103 mmol/L (ref 98–110)
CO2: 24 mmol/L (ref 20–31)
Creat: 0.69 mg/dL (ref 0.50–1.10)
GFR, Est African American: >89 mL/min (ref >=60)
Glucose, Bld: 84 mg/dL (ref 65–99)
Potassium: 3.7 mmol/L (ref 3.5–5.3)
Sodium: 138 mmol/L (ref 135–146)
Total Protein: 6.9 g/dL (ref 6.1–8.1)

## 2017-01-19 LAB — POCT URINE PREGNANCY: PREG TEST UR: NEGATIVE

## 2017-01-19 MED ORDER — LINACLOTIDE 72 MCG PO CAPS: 72.0000 ug | ORAL_CAPSULE | Freq: Every day | ORAL | 1 refills | Status: DC

## 2017-01-19 MED FILL — !LINZESS 72 MCG CAPSULE: 72 MCG | 30 days supply | Qty: 30 | Fill #0

## 2017-01-19 NOTE — Progress Notes (Signed)
[name], a 46 year old female with a history of chronic constipation presents for follow up. She says that she has bowel movements 1-2 times per week. Stools are typically formed and hard. Onset was greater than 1 year ago.Defecation has been difficult, incomplete and painful. Patient has been on a high fiber diet and increased water intake without improvement. She was also prescribed Miralax. She says that she has not had medication in greater than 1 month. Symptoms have been gradually worsening. She endorses abdominal pain, dysuria, urinary frequency, and urinary urgency. She denies headache, chest pain, nausea, vomiting, or diarrhea. She has never had a colonoscopy.   Past Medical History:  Diagnosis Date  . Genital mutilation, female   . GERD (gastroesophageal reflux disease)   . SVD (spontaneous vaginal delivery) 05/28/2011   Social History   Social History  . Marital status: Married    Spouse name: N/A  . Number of children: 2  . Years of education: master's   Occupational History  . homemaker    Social History Main Topics  . Smoking status: Never Smoker  . Smokeless tobacco: Never Used  . Alcohol use No  . Drug use: No  . Sexual activity: Yes    Partners: Male    Birth control/ protection: None     Comment: stopped COC to become pregnant   Other Topics Concern  . Not on file   Social History Narrative   From [place]. Came to the [place] in 2011. Master's Degree in [employer].   Lives with her husband (came here from [place] in 2004) and their 2 children.    Review of Systems  Constitutional: Negative for diaphoresis, fever and weight loss.  HENT: Negative.   Eyes: Negative.   Respiratory: Negative.   Cardiovascular: Negative.  Negative for chest pain and palpitations.  Gastrointestinal: Positive for abdominal pain, constipation and nausea. Negative for blood in stool.  Genitourinary: Negative.   Musculoskeletal: Positive for myalgias (Right shoulder discomfort).   Skin: Negative.   Neurological: Negative.  Negative for weakness.  Endo/Heme/Allergies: Negative.   Psychiatric/Behavioral: Negative.     Physical Exam  Constitutional: She is oriented to person, place, and time and well-developed, well-nourished, and in no distress.  HENT:  Head: Normocephalic and atraumatic.  Right Ear: External ear normal.  Left Ear: External ear normal.  Nose: Nose normal.  Mouth/Throat: Oropharynx is clear and moist.  Eyes: Conjunctivae and EOM are normal. Pupils are equal, round, and reactive to light.  Neck: Normal range of motion. Neck supple.  Cardiovascular: Normal rate, regular rhythm and normal heart sounds.   Pulmonary/Chest: Effort normal and breath sounds normal.  Abdominal: Soft. Bowel sounds are normal. She exhibits no shifting dullness, no ascites and no mass. There is generalized tenderness. There is no CVA tenderness.  Musculoskeletal: Normal range of motion.  Neurological: She is alert and oriented to person, place, and time. Gait normal.  Skin: Skin is warm and dry. No rash noted. No erythema. No pallor.  Psychiatric: Mood, memory, affect and judgment normal.   Plan  1. Chronic constipation I suspect irritable bowel syndrome. Will start a trial of Linzess 72 mcg daily. Will continue a high fiber diet and increase daily water intake. Patient is in the process of applying for insurance. Will consider a diagnostic colonoscopy at that time.  - linaclotide (LINZESS) 72 MCG capsule; Take 1 capsule (72 mcg total) by mouth daily before breakfast.  Dispense: 30 capsule; Refill: 1  2. Urinary urgency  Urinalysis negative.   3. Urinary frequency -POCT urinalysis  4. Dysuria -POCT urinalysis  5. Generalized abdominal pain - POCT urine pregnancy - COMPLETE METABOLIC PANEL WITH GFR - CBC with Differential - US Abdomen Complete; Future   RTC: 1 month for chronic constipation   Donia Pounds  MSN, FNP-C [facility] [address] [place], [place] [address] [phone]

## 2017-01-19 NOTE — Patient Instructions (Addendum)
Constipation: Will start a trial of Linzess 72 mg daily for constipation.  A high fiber diet with plenty of fluids (up to 8 glasses of water daily) is suggested to relieve these symptoms.      High-Fiber Diet Fiber, also called dietary fiber, is a type of carbohydrate found in fruits, vegetables, whole grains, and beans. A high-fiber diet can have many health benefits. Your health care provider may recommend a high-fiber diet to help:  Prevent constipation. Fiber can make your bowel movements more regular.  Lower your cholesterol.  Relieve hemorrhoids, uncomplicated diverticulosis, or irritable bowel syndrome.  Prevent overeating as part of a weight-loss plan.  Prevent heart disease, type 2 diabetes, and certain cancers. What is my plan? The recommended daily intake of fiber includes:  38 grams for men under age 57.  26 grams for men over age 26.  100 grams for women under age 20.  53 grams for women over age 63. You can get the recommended daily intake of dietary fiber by eating a variety of fruits, vegetables, grains, and beans. Your health care provider may also recommend a fiber supplement if it is not possible to get enough fiber through your diet. What do I need to know about a high-fiber diet?  Fiber supplements have not been widely studied for their effectiveness, so it is better to get fiber through food sources.  Always check the fiber content on thenutrition facts label of any prepackaged food. Look for foods that contain at least 5 grams of fiber per serving.  Ask your dietitian if you have questions about specific foods that are related to your condition, especially if those foods are not listed in the following section.  Increase your daily fiber consumption gradually. Increasing your intake of dietary fiber too quickly may cause bloating, cramping, or gas.  Drink plenty of water. Water helps you to digest fiber. What foods can I eat? Grains  Whole-grain breads.  Multigrain cereal. Oats and oatmeal. Brown rice. Barley. Bulgur wheat. [place]. Bran muffins. Popcorn. Rye wafer crackers. Vegetables  Sweet potatoes. Spinach. Kale. Artichokes. Cabbage. Broccoli. Green peas. Carrots. Squash. Fruits  Berries. Pears. Apples. Oranges. Avocados. Prunes and raisins. Dried figs. Meats and Other Protein Sources  [place], kidney, pinto, and soy beans. Split peas. Lentils. Nuts and seeds. Dairy  Fiber-fortified yogurt. Beverages  Fiber-fortified soy milk. Fiber-fortified orange juice. Other  Fiber bars. The items listed above may not be a complete list of recommended foods or beverages. Contact your dietitian for more options.  What foods are not recommended? Grains  White bread. Pasta made with refined flour. White rice. Vegetables  Fried potatoes. Canned vegetables. Well-cooked vegetables. Fruits  Fruit juice. Cooked, strained fruit. Meats and Other Protein Sources  Fatty cuts of meat. Fried Sales executive or fried fish. Dairy  Milk. Yogurt. Cream cheese. Sour cream. Beverages  Soft drinks. Other  Cakes and pastries. Butter and oils. The items listed above may not be a complete list of foods and beverages to avoid. Contact your dietitian for more information.  What are some tips for including high-fiber foods in my diet?  Eat a wide variety of high-fiber foods.  Make sure that half of all grains consumed each day are whole grains.  Replace breads and cereals made from refined flour or white flour with whole-grain breads and cereals.  Replace white rice with brown rice, bulgur wheat, or millet.  Start the day with a breakfast that is high in fiber, such as a cereal that contains  at least 5 grams of fiber per serving.  Use beans in place of meat in soups, salads, or pasta.  Eat high-fiber snacks, such as berries, raw vegetables, nuts, or popcorn. This information is not intended to replace advice given to you by your health care provider. Make sure you  discuss any questions you have with your health care provider. Document Released: 08/29/2005 Document Revised: 02/04/2016 Document Reviewed: 02/11/2014 Elsevier Interactive Patient Education  2017 Elsevier Inc.  Constipation, Adult Constipation is when a person:  Poops (has a bowel movement) fewer times in a week than normal.  Has a hard time pooping.  Has poop that is dry, hard, or bigger than normal. Follow these instructions at home: Eating and drinking    Eat foods that have a lot of fiber, such as:  Fresh fruits and vegetables.  Whole grains.  Beans.  Eat less of foods that are high in fat, low in fiber, or overly processed, such as:  [place] fries.  Hamburgers.  Cookies.  Candy.  Soda.  Drink enough fluid to keep your pee (urine) clear or pale yellow. General instructions   Exercise regularly or as told by your doctor.  Go to the restroom when you feel like you need to poop. Do not hold it in.  Take over-the-counter and prescription medicines only as told by your doctor. These include any fiber supplements.  Do pelvic floor retraining exercises, such as:  Doing deep breathing while relaxing your lower belly (abdomen).  Relaxing your pelvic floor while pooping.  Watch your condition for any changes.  Keep all follow-up visits as told by your doctor. This is important. Contact a doctor if:  You have pain that gets worse.  You have a fever.  You have not pooped for 4 days.  You throw up (vomit).  You are not hungry.  You lose weight.  You are bleeding from the anus.  You have thin, pencil-like poop (stool). Get help right away if:  You have a fever, and your symptoms suddenly get worse.  You leak poop or have blood in your poop.  Your belly feels hard or bigger than normal (is bloated).  You have very bad belly pain.  You feel dizzy or you faint. This information is not intended to replace advice given to you by your health care  provider. Make sure you discuss any questions you have with your health care provider. Document Released: 02/15/2008 Document Revised: 03/18/2016 Document Reviewed: 02/17/2016 Elsevier Interactive Patient Education  2017 [employer].

## 2017-01-20 LAB — POCT URINALYSIS DIP (DEVICE)
Bilirubin Urine: NEGATIVE
GLUCOSE, UA: NEGATIVE mg/dL
Hgb urine dipstick: NEGATIVE
KETONES UR: NEGATIVE mg/dL
LEUKOCYTES UA: NEGATIVE
Nitrite: NEGATIVE
Protein, ur: NEGATIVE mg/dL
SPECIFIC GRAVITY, URINE: 1.01 (ref 1.005–1.030)
Urobilinogen, UA: 0.2 mg/dL (ref 0.0–1.0)
pH: 5.5 (ref 5.0–8.0)

## 2017-01-24 ENCOUNTER — Ambulatory Visit (HOSPITAL_COMMUNITY)
Admission: RE | Admit: 2017-01-24 | Discharge: 2017-01-24 | Disposition: A | Discharge status: Home / Self Care | Payer: Self-pay | Source: Ambulatory Visit | Attending: Family Medicine | Admitting: Family Medicine

## 2017-01-24 DIAGNOSIS — R1084 Generalized abdominal pain: Secondary | ICD-10-CM | POA: Insufficient documentation

## 2017-02-28 ENCOUNTER — Encounter: Payer: Self-pay | Admitting: Family Medicine

## 2017-02-28 ENCOUNTER — Ambulatory Visit (INDEPENDENT_AMBULATORY_CARE_PROVIDER_SITE_OTHER): Payer: Self-pay | Admitting: Family Medicine

## 2017-02-28 VITALS — BP 106/74 | HR 78 | Temp 98.5°F | Resp 16 | Ht 65.0 in | Wt 172.0 lb

## 2017-02-28 DIAGNOSIS — K649 Unspecified hemorrhoids: Secondary | ICD-10-CM

## 2017-02-28 DIAGNOSIS — L235 Allergic contact dermatitis due to other chemical products: Secondary | ICD-10-CM

## 2017-02-28 DIAGNOSIS — K5909 Other constipation: Secondary | ICD-10-CM

## 2017-02-28 DIAGNOSIS — Z789 Other specified health status: Secondary | ICD-10-CM

## 2017-02-28 MED ORDER — DIPHENHYDRAMINE HCL 2 % EX CREA: TOPICAL_CREAM | Freq: Three times a day (TID) | CUTANEOUS | 0 refills | Status: DC | PRN

## 2017-02-28 MED ORDER — LINACLOTIDE 145 MCG PO CAPS: 145.0000 ug | ORAL_CAPSULE | Freq: Every day | ORAL | 5 refills | Status: DC

## 2017-02-28 MED ORDER — HYDROCORTISONE 2.5 % RE CREA: 1.0000 "application " | TOPICAL_CREAM | Freq: Two times a day (BID) | RECTAL | 0 refills | Status: DC

## 2017-02-28 MED FILL — LINZESS 145 MCG CAPSULE: 145 | 30 days supply | Qty: 30 | Fill #0

## 2017-02-28 NOTE — Progress Notes (Signed)
Ms. Jannell Franta, a 46 year old female with a history of chronic constipation presents for follow up. She was started on Linzess with maximum relief. Onset was greater than 1 year ago.Defecation had been difficult, incomplete and painful. Patient has started a high fiber diet and increased water intake. She endorses abdominal pain, dysuria, urinary frequency, and urinary urgency. She denies headache, chest pain, nausea, vomiting, or diarrhea. She has never had a colonoscopy.   Ms. Pardoe is also complaining of a rash to hands. She attributes rash to recent henna tattos that were applied greater than 1 week ago. She describes rash as itching.  Appearance of rash at onset was flat and red.  Rash is primarily surround henna outline. c   Denies: abdominal pain, arthralgia, congestion, decrease in energy level and fever.   Past Medical History:  Diagnosis Date  . Genital mutilation, female   . GERD (gastroesophageal reflux disease)   . SVD (spontaneous vaginal delivery) 05/28/2011   Social History   Social History  . Marital status: Married    Spouse name: N/A  . Number of children: 2  . Years of education: master's   Occupational History  . homemaker    Social History Main Topics  . Smoking status: Never Smoker  . Smokeless tobacco: Never Used  . Alcohol use No  . Drug use: No  . Sexual activity: Yes    Partners: Male    Birth control/ protection: None     Comment: stopped COC to become pregnant   Other Topics Concern  . Not on file   Social History Narrative   From [place]. Came to the [place] in 2011. Master's Degree in [employer].   Lives with her husband (came here from [place] in 2004) and their 2 children.    Review of Systems  Constitutional: Negative for diaphoresis, fever and weight loss.  HENT: Negative.   Eyes: Negative.   Respiratory: Negative.   Cardiovascular: Negative.  Negative for chest pain and palpitations.  Gastrointestinal: Positive for constipation. Negative  for blood in stool.  Genitourinary: Negative.   Skin: Positive for rash.  Neurological: Negative.  Negative for weakness.  Endo/Heme/Allergies: Negative.   Psychiatric/Behavioral: Negative.     Physical Exam  Constitutional: She is oriented to person, place, and time and well-developed, well-nourished, and in no distress.  HENT:  Head: Normocephalic and atraumatic.  Right Ear: External ear normal.  Left Ear: External ear normal.  Nose: Nose normal.  Mouth/Throat: Oropharynx is clear and moist.  Eyes: Conjunctivae and EOM are normal. Pupils are equal, round, and reactive to light.  Neck: Normal range of motion. Neck supple.  Cardiovascular: Normal rate, regular rhythm and normal heart sounds.   Pulmonary/Chest: Effort normal and breath sounds normal.  Abdominal: Soft. Bowel sounds are normal. She exhibits no shifting dullness, no ascites and no mass. There is generalized tenderness. There is no CVA tenderness.  Genitourinary: Rectal exam shows external hemorrhoid.  Musculoskeletal: Normal range of motion.  Neurological: She is alert and oriented to person, place, and time. Gait normal.  Skin: Skin is warm and dry. Rash noted. Rash is macular. No erythema. No pallor.  Erythematous, macular rash, nontender to touch  Henna tattos   Psychiatric: Mood, memory, affect and judgment normal.   Plan  1. Chronic constipation I suspect irritable bowel syndrome. Constipation resolved on Linzess, will continue at 145 mcg daily. Will continue a high fiber diet and increase daily water intake. Patient is in the process of applying  for insurance. Will consider a diagnostic colonoscopy at that time.  - linaclotide (LINZESS) 145 MCG CAPS capsule; Take 1 capsule (145 mcg total) by mouth daily before breakfast.  Dispense: 30 capsule; Refill: 5  2. Hemorrhoids, unspecified hemorrhoid type - hydrocortisone (ANUSOL-HC) 2.5 % rectal cream; Place 1 application rectally 2 (two) times daily.  Dispense: 30 g;  Refill: 0  3. Allergic dermatitis due to other chemical product - diphenhydrAMINE (BENADRYL) 2 % cream; Apply topically 3 (three) times daily as needed for itching.  Dispense: 30 g; Refill: 0  4. Language barrier to communication Ms. Timko primarily speaks arabic, used video interpreter to assist with communication.   RTC: 3 months for pap smear   Donia Pounds  MSN, FNP-C [facility] [address] [place], [place] [address] [phone]

## 2017-02-28 NOTE — Patient Instructions (Signed)
Linaclotide oral capsules  What is this medicine?  LINACLOTIDE (lin a KLOE tide) is used to treat irritable bowel syndrome (IBS) with constipation as the main problem. It may also be used for relief of chronic constipation.  This medicine may be used for other purposes; ask your health care provider or pharmacist if you have questions.  COMMON BRAND NAME(S): Linzess  What should I tell my health care provider before I take this medicine?  They need to know if you have any of these conditions:  -history of stool (fecal) impaction  -now have diarrhea or have diarrhea often  -other medical condition  -stomach or intestinal disease, including bowel obstruction or abdominal adhesions  -an unusual or allergic reaction to linaclotide, other medicines, foods, dyes, or preservatives  -pregnant or trying to get pregnant  -breast-feeding  How should I use this medicine?  Take this medicine by mouth with a glass of water. Follow the directions on the prescription label. Do not cut, crush or chew this medicine. Take on an empty stomach, at least 30 minutes before your first meal of the day. Take your medicine at regular intervals. Do not take your medicine more often than directed. Do not stop taking except on your doctor's advice.  A special MedGuide will be given to you by the pharmacist with each prescription and refill. Be sure to read this information carefully each time.  Talk to your pediatrician regarding the use of this medicine in children. This medicine is not approved for use in children.  Overdosage: If you think you have taken too much of this medicine contact a poison control center or emergency room at once.  NOTE: This medicine is only for you. Do not share this medicine with others.  What if I miss a dose?  If you miss a dose, just skip that dose. Wait until your next dose, and take only that dose. Do not take double or extra doses.  What may interact with this medicine?  -certain medicines for bowel problems  or bladder incontinence (these can cause constipation)  This list may not describe all possible interactions. Give your health care provider a list of all the medicines, herbs, non-prescription drugs, or dietary supplements you use. Also tell them if you smoke, drink alcohol, or use illegal drugs. Some items may interact with your medicine.  What should I watch for while using this medicine?  Visit your doctor for regular check ups. Tell your doctor if your symptoms do not get better or if they get worse.  Diarrhea is a common side effect of this medicine. It often begins within 2 weeks of starting this medicine. Stop taking this medicine and call your doctor if you get severe diarrhea.  Stop taking this medicine and call your doctor or go to the nearest hospital emergency room right away if you develop unusual or severe stomach-area (abdominal) pain, especially if you also have bright red, bloody stools or black stools that look like tar.  What side effects may I notice from receiving this medicine?  Side effects that you should report to your doctor or health care professional as soon as possible:  -allergic reactions like skin rash, itching or hives, swelling of the face, lips, or tongue  -black, tarry stools  -bloody or watery diarrhea  -new or worsening stomach pain  -severe or prolonged diarrhea  Side effects that usually do not require medical attention (report to your doctor or health care professional if they continue or are   my medicine? Keep out of the reach of children. Store at room temperature between 20 and 25 degrees C (68 and 77 degrees F). Keep this medicine in the original container. Keep tightly closed in a dry place. Do not remove the desiccant packet from the bottle,  it helps to protect your medicine from moisture. Throw away any unused medicine after the expiration date. NOTE: This sheet is a summary. It may not cover all possible information. If you have questions about this medicine, talk to your doctor, pharmacist, or health care provider.  2018 Elsevier/Gold Standard (2015-10-01 12:17:04) About Hemorrhoids  Hemorrhoids are swollen veins in the lower rectum and anus.  Also called piles, hemorrhoids are a common problem.  Hemorrhoids may be internal (inside the rectum) or external (around the anus).  Internal Hemorrhoids  Internal hemorrhoids are often painless, but they rarely cause bleeding.  The internal veins may stretch and fall down (prolapse) through the anus to the outside of the body.  The veins may then become irritated and painful.  External Hemorrhoids  External hemorrhoids can be easily seen or felt around the anal opening.  They are under the skin around the anus.  When the swollen veins are scratched or broken by straining, rubbing or wiping they sometimes bleed.  How Hemorrhoids Occur  Veins in the rectum and around the anus tend to swell under pressure.  Hemorrhoids can result from increased pressure in the veins of your anus or rectum.  Some sources of pressure are:  Straining to have a bowel movement because of constipation Waiting too long to have a bowel movement Coughing and sneezing often Sitting for extended periods of time, including on the toilet Diarrhea Obesity Trauma or injury to the anus Some liver diseases Stress Family history of hemorrhoids Pregnancy  Pregnant women should try to avoid becoming constipated, because they are more likely to have hemorrhoids during pregnancy.  In the last trimester of pregnancy, the enlarged uterus may press on blood vessels and causes hemorrhoids.  In addition, the strain of childbirth sometimes causes hemorrhoids after the birth.  Symptoms of Hemorrhoids  Some symptoms of  hemorrhoids include: Swelling and/or a tender lump around the anus Itching, mild burning and bleeding around the anus Painful bowel movements with or without constipation Bright red blood covering the stool, on toilet paper or in the toilet bowel.   Symptoms usually go away within a few days.  Always talk to your doctor about any bleeding to make sure it is not from some other causes.  Diagnosing and Treating Hemorrhoids  Diagnosis is made by an examination by your healthcare provider.  Special test can be performed by your doctor.    Most cases of hemorrhoids can be treated with: High-fiber diet: Eat more high-fiber foods, which help prevent constipation.  Ask for more detailed fiber information on types and sources of fiber from your healthcare provider. Fluids: Drink plenty of water.  This helps soften bowel movements so they are easier to pass. Sitz baths and cold packs: Sitting in lukewarm water two or three times a day for 15 minutes cleases the anal area and may relieve discomfort.  If the water is too hot, swelling around the anus will get worse.  Placing a cloth-covered ice pack on the anus for ten minutes four times a day can also help reduce selling.  Gently pushing a prolapsed hemorrhoid back inside after the bath or ice pack can be helpful. Medications: For mild discomfort, your healthcare provider may  suggest over-the-counter pain medication or prescribe a cream or ointment for topical use.  The cream may contain witch hazel, zinc oxide or petroleum jelly.  Medicated suppositories are also a treatment option.  Always consult your doctor before applying medications or creams. Procedures and surgeries: There are also a number of procedures and surgeries to shrink or remove hemorrhoids in more serious cases.  Talk to your physician about these options.  You can often prevent hemorrhoids or keep them from becoming worse by maintaining a healthy lifestyle.  Eat a fiber-rich diet of  fruits, vegetables and whole grains.  Also, drink plenty of water and exercise regularly.   2007, [employer] Doc.30Contact Dermatitis Dermatitis is redness, soreness, and swelling (inflammation) of the skin. Contact dermatitis is a reaction to certain substances that touch the skin. There are two types of contact dermatitis:  Irritant contact dermatitis. This type is caused by something that irritates your skin, such as dry hands from washing them too much. This type does not require previous exposure to the substance for a reaction to occur. This type is more common.  Allergic contact dermatitis. This type is caused by a substance that you are allergic to, such as a nickel allergy or poison ivy. This type only occurs if you have been exposed to the substance (allergen) before. Upon a repeat exposure, your body reacts to the substance. This type is less common.  What are the causes? Many different substances can cause contact dermatitis. Irritant contact dermatitis is most commonly caused by exposure to:  Makeup.  Soaps.  Detergents.  Bleaches.  Acids.  Metal salts, such as nickel.  Allergic contact dermatitis is most commonly caused by exposure to:  Poisonous plants.  Chemicals.  Jewelry.  Latex.  Medicines.  Preservatives in products, such as clothing.  What increases the risk? This condition is more likely to develop in:  People who have jobs that expose them to irritants or allergens.  People who have certain medical conditions, such as asthma or eczema.  What are the signs or symptoms? Symptoms of this condition may occur anywhere on your body where the irritant has touched you or is touched by you. Symptoms include:  Dryness or flaking.  Redness.  Cracks.  Itching.  Pain or a burning feeling.  Blisters.  Drainage of small amounts of blood or clear fluid from skin cracks.  With allergic contact dermatitis, there may also be swelling in  areas such as the eyelids, mouth, or genitals. How is this diagnosed? This condition is diagnosed with a medical history and physical exam. A patch skin test may be performed to help determine the cause. If the condition is related to your job, you may need to see an occupational medicine specialist. How is this treated? Treatment for this condition includes figuring out what caused the reaction and protecting your skin from further contact. Treatment may also include:  Steroid creams or ointments. Oral steroid medicines may be needed in more severe cases.  Antibiotics or antibacterial ointments, if a skin infection is present.  Antihistamine lotion or an antihistamine taken by mouth to ease itching.  A bandage (dressing).  Follow these instructions at home: [place] your skin as needed.  Apply cool compresses to the affected areas.  Try taking a bath with: ? Epsom salts. Follow the instructions on the packaging. You can get these at your local pharmacy or grocery store. ? Baking soda. Pour a small amount into the bath as directed by  your health care provider. ? Colloidal oatmeal. Follow the instructions on the packaging. You can get this at your local pharmacy or grocery store.  Try applying baking soda paste to your skin. Stir water into baking soda until it reaches a paste-like consistency.  Do not scratch your skin.  Bathe less frequently, such as every other day.  Bathe in lukewarm water. Avoid using hot water. Medicines  Take or apply over-the-counter and prescription medicines only as told by your health care provider.  If you were prescribed an antibiotic medicine, take or apply your antibiotic as told by your health care provider. Do not stop using the antibiotic even if your condition starts to improve. General instructions  Keep all follow-up visits as told by your health care provider. This is important.  Avoid the substance that caused your  reaction. If you do not know what caused it, keep a journal to try to track what caused it. Write down: ? What you eat. ? What cosmetic products you use. ? What you drink. ? What you wear in the affected area. This includes jewelry.  If you were given a dressing, take care of it as told by your health care provider. This includes when to change and remove it. Contact a health care provider if:  Your condition does not improve with treatment.  Your condition gets worse.  You have signs of infection such as swelling, tenderness, redness, soreness, or warmth in the affected area.  You have a fever.  You have new symptoms. Get help right away if:  You have a severe headache, neck pain, or neck stiffness.  You vomit.  You feel very sleepy.  You notice red streaks coming from the affected area.  Your bone or joint underneath the affected area becomes painful after the skin has healed.  The affected area turns darker.  You have difficulty breathing. This information is not intended to replace advice given to you by your health care provider. Make sure you discuss any questions you have with your health care provider. Document Released: 08/26/2000 Document Revised: 02/04/2016 Document Reviewed: 01/14/2015 Elsevier Interactive Patient Education  2018 [employer].

## 2017-03-01 MED FILL — HYDROCORTISONE 2.5% CREAM: 2.5 | 20 days supply | Qty: 30 | Fill #0

## 2017-03-20 ENCOUNTER — Other Ambulatory Visit: Payer: Self-pay | Admitting: *Deleted

## 2017-03-20 DIAGNOSIS — K5909 Other constipation: Secondary | ICD-10-CM

## 2017-03-20 MED ORDER — LINACLOTIDE 145 MCG PO CAPS
145.0000 ug | ORAL_CAPSULE | Freq: Every day | ORAL | 3 refills | Status: DC
Start: 2017-03-20 — End: 2017-12-27

## 2017-03-20 NOTE — Telephone Encounter (Signed)
PRINTED FOR PASS PROGRAM 

## 2017-05-25 MED FILL — !LINZESS 145 MCG CAPSULE: 145 | 30 days supply | Qty: 30 | Fill #1

## 2017-05-30 ENCOUNTER — Other Ambulatory Visit: Payer: Self-pay | Admitting: Family Medicine

## 2017-05-30 MED FILL — ?OMEPRAZOLE DR 20 MG CAPSUL: 20 | 30 days supply | Qty: 30 | Fill #0

## 2017-06-02 ENCOUNTER — Encounter: Payer: Self-pay | Admitting: Family Medicine

## 2017-06-02 ENCOUNTER — Other Ambulatory Visit (HOSPITAL_COMMUNITY)
Admission: RE | Admit: 2017-06-02 | Discharge: 2017-06-02 | Disposition: A | Discharge status: Home / Self Care | Payer: Medicaid Other | Source: Ambulatory Visit | Attending: Internal Medicine | Admitting: Internal Medicine

## 2017-06-02 ENCOUNTER — Ambulatory Visit (INDEPENDENT_AMBULATORY_CARE_PROVIDER_SITE_OTHER): Payer: Self-pay | Admitting: Family Medicine

## 2017-06-02 VITALS — BP 106/58 | HR 94 | Temp 98.8°F | Resp 16 | Ht 65.0 in | Wt 178.0 lb

## 2017-06-02 DIAGNOSIS — K5909 Other constipation: Secondary | ICD-10-CM

## 2017-06-02 DIAGNOSIS — N888 Other specified noninflammatory disorders of cervix uteri: Secondary | ICD-10-CM

## 2017-06-02 DIAGNOSIS — Z01419 Encounter for gynecological examination (general) (routine) without abnormal findings: Secondary | ICD-10-CM | POA: Diagnosis present

## 2017-06-02 DIAGNOSIS — K649 Unspecified hemorrhoids: Secondary | ICD-10-CM

## 2017-06-02 DIAGNOSIS — N90810 Female genital mutilation status, unspecified: Secondary | ICD-10-CM

## 2017-06-02 LAB — POCT URINALYSIS DIP (DEVICE)
BILIRUBIN URINE: NEGATIVE
GLUCOSE, UA: NEGATIVE mg/dL
Hgb urine dipstick: NEGATIVE
LEUKOCYTES UA: NEGATIVE
NITRITE: NEGATIVE
Protein, ur: NEGATIVE mg/dL
Specific Gravity, Urine: 1.02 (ref 1.005–1.030)
Urobilinogen, UA: 0.2 mg/dL (ref 0.0–1.0)
pH: 7.5 (ref 5.0–8.0)

## 2017-06-02 NOTE — Patient Instructions (Signed)
Will send a referral to gynecology for further work up and evaluation Will also send a referral to gastroenterology Hemorrhoids Hemorrhoids are swollen veins in and around the rectum or anus. There are two types of hemorrhoids:  Internal hemorrhoids. These occur in the veins that are just inside the rectum. They may poke through to the outside and become irritated and painful.  External hemorrhoids. These occur in the veins that are outside of the anus and can be felt as a painful swelling or hard lump near the anus.  Most hemorrhoids do not cause serious problems, and they can be managed with home treatments such as diet and lifestyle changes. If home treatments do not help your symptoms, procedures can be done to shrink or remove the hemorrhoids. What are the causes? This condition is caused by increased pressure in the anal area. This pressure may result from various things, including:  Constipation.  Straining to have a bowel movement.  Diarrhea.  Pregnancy.  Obesity.  Sitting for long periods of time.  Heavy lifting or other activity that causes you to strain.  Anal sex.  What are the signs or symptoms? Symptoms of this condition include:  Pain.  Anal itching or irritation.  Rectal bleeding.  Leakage of stool (feces).  Anal swelling.  One or more lumps around the anus.  How is this diagnosed? This condition can often be diagnosed through a visual exam. Other exams or tests may also be done, such as:  Examination of the rectal area with a gloved hand (digital rectal exam).  Examination of the anal canal using a small tube (anoscope).  A blood test, if you have lost a significant amount of blood.  A test to look inside the colon (sigmoidoscopy or colonoscopy).  How is this treated? This condition can usually be treated at home. However, various procedures may be done if dietary changes, lifestyle changes, and other home treatments do not help your symptoms.  These procedures can help make the hemorrhoids smaller or remove them completely. Some of these procedures involve surgery, and others do not. Common procedures include:  Rubber band ligation. Rubber bands are placed at the base of the hemorrhoids to cut off the blood supply to them.  Sclerotherapy. Medicine is injected into the hemorrhoids to shrink them.  Infrared coagulation. A type of light energy is used to get rid of the hemorrhoids.  Hemorrhoidectomy surgery. The hemorrhoids are surgically removed, and the veins that supply them are tied off.  Stapled hemorrhoidopexy surgery. A circular stapling device is used to remove the hemorrhoids and use staples to cut off the blood supply to them.  Follow these instructions at home: Eating and drinking  Eat foods that have a lot of fiber in them, such as whole grains, beans, nuts, fruits, and vegetables. Ask your health care provider about taking products that have added fiber (fiber supplements).  Drink enough fluid to keep your urine clear or pale yellow. Managing pain and swelling  Take warm sitz baths for 20 minutes, 3-4 times a day to ease pain and discomfort.  If directed, apply ice to the affected area. Using ice packs between sitz baths may be helpful. ? Put ice in a plastic bag. ? Place a towel between your skin and the bag. ? Leave the ice on for 20 minutes, 2-3 times a day. General instructions  Take over-the-counter and prescription medicines only as told by your health care provider.  Use medicated creams or suppositories as told.  Exercise  regularly.  Go to the bathroom when you have the urge to have a bowel movement. Do not wait.  Avoid straining to have bowel movements.  Keep the anal area dry and clean. Use wet toilet paper or moist towelettes after a bowel movement.  Do not sit on the toilet for long periods of time. This increases blood pooling and pain. Contact a health care provider if:  You have  increasing pain and swelling that are not controlled by treatment or medicine.  You have uncontrolled bleeding.  You have difficulty having a bowel movement, or you are unable to have a bowel movement.  You have pain or inflammation outside the area of the hemorrhoids. This information is not intended to replace advice given to you by your health care provider. Make sure you discuss any questions you have with your health care provider. Document Released: 08/26/2000 Document Revised: 01/27/2016 Document Reviewed: 05/13/2015 Elsevier Interactive Patient Education  2017 [employer].

## 2017-06-02 NOTE — Progress Notes (Signed)
Subjective:     Kirsten Torres is a 46 y.o. female with a history of genital mutiliation and chronic constipation presents for a routine gynecological exam. She says that she is sexually active. She has had 2 vaginal births. She denies abnormal discharge. She has not had a vaginal exam or mammogram over the past several years.     Gynecologic History Patient's last menstrual period was 05/14/2017.  Obstetric History OB History  Gravida Para Term Preterm AB Living  3 2 2  0 0 2  SAB TAB Ectopic Multiple Live Births  0 0 0 0 2    # Outcome Date GA Lbr Len/2nd Weight Sex Delivery Anes PTL Lv  3 Term 05/28/11 [redacted]w[redacted]d 10:35 / 00:06 7 lb 2.5 oz (3.246 kg) F Vag-Spont   LIV     Birth Comments: none  2 Term 2007 [redacted]w[redacted]d  7 lb 11.5 oz (3.5 kg) M Vag-Spont None N LIV  1 Gravida              Birth Comments: System Generated. Please review and update pregnancy details.     Past Medical History:  Diagnosis Date  . Genital mutilation, female   . GERD (gastroesophageal reflux disease)   . SVD (spontaneous vaginal delivery) 05/28/2011   Social History   Social History  . Marital status: Married    Spouse name: N/A  . Number of children: 2  . Years of education: master's   Occupational History  . homemaker    Social History Main Topics  . Smoking status: Never Smoker  . Smokeless tobacco: Never Used  . Alcohol use No  . Drug use: No  . Sexual activity: Yes    Partners: Male    Birth control/ protection: None     Comment: stopped COC to become pregnant   Other Topics Concern  . Not on file   Social History Narrative   From [place]. Came to the [place] in 2011. Master's Degree in [employer].   Lives with her husband (came here from [place] in 2004) and their 2 children.     Review of Systems Review of Systems  Constitutional: Negative for malaise/fatigue and weight loss.  HENT: Negative.   Eyes: Negative.   Respiratory: Negative.   Cardiovascular: Negative.   Gastrointestinal:  Positive for constipation. Negative for blood in stool, heartburn, nausea and vomiting.       Hemorrhoids  Genitourinary: Negative.   Musculoskeletal: Negative.   Skin: Negative.   Neurological: Negative.   Endo/Heme/Allergies: Negative.     Objective:  Physical Exam  Constitutional: She is oriented to person, place, and time.  Eyes: Pupils are equal, round, and reactive to light.  Neck: Normal range of motion.  Cardiovascular: Normal rate and normal heart sounds.   Pulmonary/Chest: Effort normal and breath sounds normal.  Abdominal: Soft. Bowel sounds are normal.  Genitourinary: Rectal exam shows internal hemorrhoid. Cervix exhibits lesion (0.5 cm, hypepigmented, tender to touch, on bimanual exam). Vagina exhibits abnormal mucosa. Thin and vaginal discharge found.  Genitourinary Comments: History of female circumcision   Neurological: She is alert and oriented to person, place, and time. Gait normal.  Skin: Skin is warm and dry.   Assessment:  BP (!) 106/58 (BP Location: Left Arm, Patient Position: Sitting, Cuff Size: Large)   Pulse 94   Temp 98.8 F (37.1 C) (Oral)   Resp 16   Ht 5\' 5"  (1.651 m)   Wt 178 lb (80.7 kg)   LMP 05/14/2017  SpO2 100%   BMI 29.62 kg/m  Plan:  1. Pap smear, as part of routine gynecological examination Mammogram ordered previously - POCT urinalysis dip (device) - Cytology - PAP [place]  2. 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 Hemorrhoids, unspecified hemorrhoid type - Ambulatory referral to Gastroenterology  4. Cyst of cervix - Ambulatory referral to Gynecology  5. History of female genital mutilation    RTC: 6 months for chronic  conditions   [place]  MSN, FNP-C Patient [facility] [address] [place], [place] [address] [phone]

## 2017-06-06 ENCOUNTER — Telehealth: Payer: Self-pay | Admitting: Obstetrics and Gynecology

## 2017-06-06 LAB — CYTOLOGY - PAP: Diagnosis: NEGATIVE

## 2017-06-06 NOTE — Telephone Encounter (Signed)
Called and spoke with the patient's spouse who stated he will schedule the appointment for the patient with our office. However, he was driving and will call back to schedule.

## 2017-07-05 ENCOUNTER — Ambulatory Visit: Payer: No Typology Code available for payment source

## 2017-07-05 ENCOUNTER — Ambulatory Visit: Payer: Medicaid Other | Admitting: Obstetrics and Gynecology

## 2017-07-24 ENCOUNTER — Encounter: Payer: Self-pay | Admitting: Gastroenterology

## 2017-08-15 MED FILL — ?OMEPRAZOLE DR 20MG CAPSULE: 20 | 30 days supply | Qty: 30 | Fill #1

## 2017-08-15 MED FILL — $LINZESS 145MCG CAPSULE: 145 | 30 days supply | Qty: 30 | Fill #2

## 2017-09-01 ENCOUNTER — Encounter: Payer: Self-pay | Admitting: Family Medicine

## 2017-09-01 ENCOUNTER — Ambulatory Visit: Payer: Medicaid Other | Admitting: Family Medicine

## 2017-09-01 ENCOUNTER — Ambulatory Visit: Payer: No Typology Code available for payment source | Admitting: Family Medicine

## 2017-09-01 VITALS — BP 104/72 | HR 79 | Temp 98.8°F | Resp 16 | Ht 65.0 in | Wt 185.0 lb

## 2017-09-01 DIAGNOSIS — M542 Cervicalgia: Secondary | ICD-10-CM | POA: Diagnosis not present

## 2017-09-01 DIAGNOSIS — M62838 Other muscle spasm: Secondary | ICD-10-CM | POA: Diagnosis not present

## 2017-09-01 DIAGNOSIS — M5030 Other cervical disc degeneration, unspecified cervical region: Secondary | ICD-10-CM | POA: Diagnosis not present

## 2017-09-01 MED ORDER — MELOXICAM 7.5 MG PO TABS: 7.5000 mg | ORAL_TABLET | Freq: Every day | ORAL | 0 refills | Status: DC

## 2017-09-01 MED ORDER — KETOROLAC TROMETHAMINE 60 MG/2ML IM SOLN
60.0000 mg | Freq: Once | INTRAMUSCULAR | Status: AC
  Administered 2017-09-01: 60 mg via INTRAMUSCULAR

## 2017-09-01 MED ORDER — CYCLOBENZAPRINE HCL 5 MG PO TABS: 5.0000 mg | ORAL_TABLET | Freq: Every evening | ORAL | 0 refills | Status: DC | PRN

## 2017-09-01 MED FILL — MELOXICAM 7.5 MG TABLET: 7.5 | 30 days supply | Qty: 30 | Fill #0

## 2017-09-01 MED FILL — ?CYCLOBENZAPRINE 5 MG TABLE: 5 | 30 days supply | Qty: 30 | Fill #0

## 2017-09-01 NOTE — Patient Instructions (Signed)
You have degenerative disc disease and the C5 and C6 aspect of the neck.  You and I reviewed the images from your most recent neck x-ray.  We will start a trial of meloxicam 7.5 mg daily with breakfast to relieve mild to moderate neck pain.  For neck muscle spasms that occur mostly at night, will start a trial of cyclobenzaprine 5 mg at bedtime as needed.  Apply warm moist compresses to neck as needed for added pain relief.  Will give a copy of back exercises to help with neck stiffness and neck pain.  Back Exercises If you have pain in your back, do these exercises 2-3 times each day or as told by your doctor. When the pain goes away, do the exercises once each day, but repeat the steps more times for each exercise (do more repetitions). If you do not have pain in your back, do these exercises once each day or as told by your doctor. Exercises Single Knee to Chest  Do these steps 3-5 times in a row for each leg: 1. Lie on your back on a firm bed or the floor with your legs stretched out. 2. Bring one knee to your chest. 3. Hold your knee to your chest by grabbing your knee or thigh. 4. Pull on your knee until you feel a gentle stretch in your lower back. 5. Keep doing the stretch for 10-30 seconds. 6. Slowly let go of your leg and straighten it.  Pelvic Tilt  Do these steps 5-10 times in a row: 1. Lie on your back on a firm bed or the floor with your legs stretched out. 2. Bend your knees so they point up to the ceiling. Your feet should be flat on the floor. 3. Tighten your lower belly (abdomen) muscles to press your lower back against the floor. This will make your tailbone point up to the ceiling instead of pointing down to your feet or the floor. 4. Stay in this position for 5-10 seconds while you gently tighten your muscles and breathe evenly.  Cat-Cow  Do these steps until your lower back bends more easily: 1. Get on your hands and knees on a firm surface. Keep your hands under your  shoulders, and keep your knees under your hips. You may put padding under your knees. 2. Let your head hang down, and make your tailbone point down to the floor so your lower back is round like the back of a cat. 3. Stay in this position for 5 seconds. 4. Slowly lift your head and make your tailbone point up to the ceiling so your back hangs low (sags) like the back of a cow. 5. Stay in this position for 5 seconds.  Press-Ups  Do these steps 5-10 times in a row: 1. Lie on your belly (face-down) on the floor. 2. Place your hands near your head, about shoulder-width apart. 3. While you keep your back relaxed and keep your hips on the floor, slowly straighten your arms to raise the top half of your body and lift your shoulders. Do not use your back muscles. To make yourself more comfortable, you may change where you place your hands. 4. Stay in this position for 5 seconds. 5. Slowly return to lying flat on the floor.  Bridges  Do these steps 10 times in a row: 1. Lie on your back on a firm surface. 2. Bend your knees so they point up to the ceiling. Your feet should be flat on the  floor. 3. Tighten your butt muscles and lift your butt off of the floor until your waist is almost as high as your knees. If you do not feel the muscles working in your butt and the back of your thighs, slide your feet 1-2 inches farther away from your butt. 4. Stay in this position for 3-5 seconds. 5. Slowly lower your butt to the floor, and let your butt muscles relax.  If this exercise is too easy, try doing it with your arms crossed over your chest. Belly Crunches  Do these steps 5-10 times in a row: 1. Lie on your back on a firm bed or the floor with your legs stretched out. 2. Bend your knees so they point up to the ceiling. Your feet should be flat on the floor. 3. Cross your arms over your chest. 4. Tip your chin a little bit toward your chest but do not bend your neck. 5. Tighten your belly muscles and  slowly raise your chest just enough to lift your shoulder blades a tiny bit off of the floor. 6. Slowly lower your chest and your head to the floor.  Back Lifts Do these steps 5-10 times in a row: 1. Lie on your belly (face-down) with your arms at your sides, and rest your forehead on the floor. 2. Tighten the muscles in your legs and your butt. 3. Slowly lift your chest off of the floor while you keep your hips on the floor. Keep the back of your head in line with the curve in your back. Look at the floor while you do this. 4. Stay in this position for 3-5 seconds. 5. Slowly lower your chest and your face to the floor.  Contact a doctor if:  Your back pain gets a lot worse when you do an exercise.  Your back pain does not lessen 2 hours after you exercise. If you have any of these problems, stop doing the exercises. Do not do them again unless your doctor says it is okay. Get help right away if:  You have sudden, very bad back pain. If this happens, stop doing the exercises. Do not do them again unless your doctor says it is okay. This information is not intended to replace advice given to you by your health care provider. Make sure you discuss any questions you have with your health care provider. Document Released: 10/01/2010 Document Revised: 02/04/2016 Document Reviewed: 10/23/2014 Elsevier Interactive Patient Education  2018 [employer].  Musculoskeletal Pain Musculoskeletal pain is muscle and bone aches and pains. This pain can occur in any part of the body. Follow these instructions at home:  Only take medicines for pain, discomfort, or fever as told by your health care provider.  You may continue all activities unless the activities cause more pain. When the pain lessens, slowly resume normal activities. Gradually increase the intensity and duration of the activities or exercise.  During periods of severe pain, bed rest may be helpful. Lie or sit in any position that is  comfortable, but get out of bed and walk around at least every several hours.  If directed, put ice on the injured area. ? Put ice in a plastic bag. ? Place a towel between your skin and the bag. ? Leave the ice on for 20 minutes, 2-3 times a day. Contact a health care provider if:  Your pain is getting worse.  Your pain is not relieved with medicines.  You lose function in the area of the  pain if the pain is in your arms, legs, or neck. This information is not intended to replace advice given to you by your health care provider. Make sure you discuss any questions you have with your health care provider. Document Released: 08/29/2005 Document Revised: 02/09/2016 Document Reviewed: 05/03/2013 Elsevier Interactive Patient Education  2017 [employer].

## 2017-09-08 NOTE — Progress Notes (Signed)
Subjective:     Kirsten Torres is a 46 y.o. female who presents for evaluation of neck pain. Patient denies any recent trauma, falls or injuries. She says that neck pain has been worsening over the past week.  Current symptoms are pain primarily to left neck with limited movement. Pain intensity is 7/10 characterized as intermittent and aching. Patient has had prior neck problems. Previous xray of the lumbar spine showed the following: IMPRESSION: Straightened alignment.  Mild degenerative disc disease at C5-6. Past Medical History:  Diagnosis Date  . Genital mutilation, female   . GERD (gastroesophageal reflux disease)   . SVD (spontaneous vaginal delivery) 05/28/2011   Social History   Socioeconomic History  . Marital status: Married    Spouse name: Not on file  . Number of children: 2  . Years of education: master's  . Highest education level: Not on file  Social Needs  . Financial resource strain: Not on file  . Food insecurity - worry: Not on file  . Food insecurity - inability: Not on file  . Transportation needs - medical: Not on file  . Transportation needs - non-medical: Not on file  Occupational History  . Occupation: homemaker  Tobacco Use  . Smoking status: Never Smoker  . Smokeless tobacco: Never Used  Substance and Sexual Activity  . Alcohol use: No  . Drug use: No  . Sexual activity: Yes    Partners: Male    Birth control/protection: None    Comment: stopped COC to become pregnant  Other Topics Concern  . Not on file  Social History Narrative   From [place]. Came to the [place] in 2011. Master's Degree in [employer].   Lives with her husband (came here from [place] in 2004) and their 2 children.   Immunization History  Administered Date(s) Administered  . Tdap 05/29/2011    Review of Systems  Constitutional: Negative.   HENT: Negative.   Eyes: Negative.  Negative for discharge and redness.  Respiratory: Negative.   Cardiovascular: Negative.    Gastrointestinal: Negative.   Genitourinary: Negative.   Musculoskeletal: Positive for myalgias and neck pain.  Skin: Negative.   Neurological: Negative.   Endo/Heme/Allergies: Negative.     Objective:    BP 104/72 (BP Location: Left Arm, Patient Position: Sitting, Cuff Size: Large)   Pulse 79   Temp 98.8 F (37.1 C) (Oral)   Resp 16   Ht 5\' 5"  (1.651 m)   Wt 185 lb (83.9 kg)   LMP 08/16/2017   SpO2 100%   BMI 30.79 kg/m  General:   alert, cooperative, appears stated age and mild distress  External Deformity:  absent  ROM Cervical Spine:  Limited ROM due to increased pain  Midline Tenderness:  moderate on the left  Paraspinous tenderness:  mild midline  UE Neurologic Exam:  Upper extremity neuro exam unremarkable   X-ray of the cervical spine: Positive findings: Mild degenerative disc disease C5-6    Assessment:    Cervical pain secondary to degenerative disk disease    Plan:  Neck pain on right side Apply warm, moist compresses to neck as needed.  Will also start a trial of Meloxicam 7.5 mg daily with food.  - meloxicam (MOBIC) 7.5 MG tablet; Take 1 tablet (7.5 mg total) by mouth daily.  Dispense: 30 tablet; Refill: 0   Cervicalgia of occipito-atlanto-axial region - ketorolac (TORADOL) injection 60 mg  Degenerative disc disease, cervical - ketorolac (TORADOL) injection 60 mg - meloxicam (MOBIC) 7.5  MG tablet; Take 1 tablet (7.5 mg total) by mouth daily.  Dispense: 30 tablet; Refill: 0 - cyclobenzaprine (FLEXERIL) 5 MG tablet; Take 1 tablet (5 mg total) by mouth at bedtime as needed for muscle spasms.  Dispense: 30 tablet; Refill: 0   Neck muscle spasm - cyclobenzaprine (FLEXERIL) 5 MG tablet; Take 1 tablet (5 mg total) by mouth at bedtime as needed for muscle spasms.  Dispense: 30 tablet; Refill: 0  Discussed the cervical pain, its course and treatment. Neurosurgeon distributed. Discussed appropriate exercises. NSAIDs per medication orders.      Donia Pounds  MSN, FNP-C Patient [facility] [address] [place], [place] [address] [phone]

## 2017-09-18 ENCOUNTER — Ambulatory Visit: Payer: Medicaid Other | Admitting: Gastroenterology

## 2017-09-18 ENCOUNTER — Encounter: Payer: Self-pay | Admitting: Gastroenterology

## 2017-09-18 VITALS — BP 132/70 | HR 84 | Ht 65.0 in | Wt 187.0 lb

## 2017-09-18 DIAGNOSIS — K5902 Outlet dysfunction constipation: Secondary | ICD-10-CM | POA: Diagnosis not present

## 2017-09-18 DIAGNOSIS — K602 Anal fissure, unspecified: Secondary | ICD-10-CM

## 2017-09-18 DIAGNOSIS — K625 Hemorrhage of anus and rectum: Secondary | ICD-10-CM

## 2017-09-18 MED ORDER — AMBULATORY NON FORMULARY MEDICATION: 1 refills | Status: DC

## 2017-09-18 NOTE — Patient Instructions (Signed)
Please go directly to [facility] and pick up the nitroglycerin ointment. Follow instructions on tube and use x 2-3 months.   Start Benefiber 1 tablespoon three times a day with meals.   Please follow up with Dr. Silverio Decamp in 2-3 months.

## 2017-09-18 NOTE — Progress Notes (Signed)
Kirsten Torres    [id number]    03-20-1971  Primary Care Physician:Hollis, Asencion Partridge, FNP  Referring Physician: Dorena Dew, FNP [address] [place], [place] [address]  Chief complaint:  Constipation  HPI: 47 year old female Arabic speaking accompanied by language interpreter is here with complaints of incomplete evacuation and anorectal pain with defecation.  She is currently taking Linzess and feels it helps her to have a bowel movement but think she is not able to completely evacuate and also has significant pain during defecation.  She has intermittent bright red blood per rectum with bowel movement or after when she wipes.  Thinks there is something blocking her from evacuating completely.  Denies any rectal trauma.  Denies any abdominal pain, nausea, vomiting or weight loss. No family history of colon cancer or IBD.   Outpatient Encounter Medications as of 09/18/2017  Medication Sig  . calcium carbonate (TUMS - DOSED IN MG ELEMENTAL CALCIUM) 500 MG chewable tablet Chew 1 tablet by mouth daily.  . cyclobenzaprine (FLEXERIL) 5 MG tablet Take 1 tablet (5 mg total) by mouth at bedtime as needed for muscle spasms.  Marland Kitchen linaclotide (LINZESS) 145 MCG CAPS capsule Take 1 capsule (145 mcg total) by mouth daily before breakfast.  . meloxicam (MOBIC) 7.5 MG tablet Take 1 tablet (7.5 mg total) by mouth daily.  . Naproxen Sodium (ALEVE PO) Take by mouth.  Marland Kitchen omeprazole (PRILOSEC) 20 MG capsule TAKE 1 CAPSULE BY MOUTH DAILY.  . [DISCONTINUED] diphenhydrAMINE (BENADRYL) 2 % cream Apply topically 3 (three) times daily as needed for itching. (Patient not taking: Reported on 06/02/2017)  . [DISCONTINUED] hydrocortisone (ANUSOL-HC) 2.5 % rectal cream Place 1 application rectally 2 (two) times daily. (Patient not taking: Reported on 06/02/2017)   No facility-administered encounter medications on file as of 09/18/2017.     Allergies as of 09/18/2017  . (No Known Allergies)     Past Medical History:  Diagnosis Date  . Genital mutilation, female   . GERD (gastroesophageal reflux disease)   . SVD (spontaneous vaginal delivery) 05/28/2011    Past Surgical History:  Procedure Laterality Date  . genital mutilation    . TONSILLECTOMY  2004    History reviewed. No pertinent family history.  Social History   Socioeconomic History  . Marital status: Married    Spouse name: Not on file  . Number of children: 2  . Years of education: master's  . Highest education level: Not on file  Social Needs  . Financial resource strain: Not on file  . Food insecurity - worry: Not on file  . Food insecurity - inability: Not on file  . Transportation needs - medical: Not on file  . Transportation needs - non-medical: Not on file  Occupational History  . Occupation: homemaker  Tobacco Use  . Smoking status: Never Smoker  . Smokeless tobacco: Never Used  Substance and Sexual Activity  . Alcohol use: No  . Drug use: No  . Sexual activity: Yes    Partners: Male    Birth control/protection: None    Comment: stopped COC to become pregnant  Other Topics Concern  . Not on file  Social History Narrative   From [place]. Came to the [place] in 2011. Master's Degree in [employer].   Lives with her husband (came here from [place] in 2004) and their 2 children.      Review of systems: Review of Systems  Constitutional:  Negative for fever and chills.  HENT: Negative.   Eyes: Negative for blurred vision.  Respiratory: Negative for cough, shortness of breath and wheezing.   Cardiovascular: Negative for chest pain and palpitations.  Gastrointestinal: as per HPI Genitourinary: Negative for dysuria, urgency, frequency and hematuria.  Musculoskeletal: Negative for myalgias, back pain and joint pain.  Skin: Negative for itching and rash.  Neurological: Negative for dizziness, tremors, focal weakness, seizures and loss of consciousness.  Endo/Heme/Allergies: Positive for  seasonal allergies.  Psychiatric/Behavioral: Negative for depression, suicidal ideas and hallucinations.  All other systems reviewed and are negative.   Physical Exam: Vitals:   09/18/17 1413  BP: 132/70  Pulse: 84   Body mass index is 31.12 kg/m. Gen:      No acute distress HEENT:  EOMI, sclera anicteric Neck:     No masses; no thyromegaly Lungs:    Clear to auscultation bilaterally; normal respiratory effort CV:         Regular rate and rhythm; no murmurs Abd:      + bowel sounds; soft, non-tender; no palpable masses, no distension Ext:    No edema; adequate peripheral perfusion Skin:      Warm and dry; no rash Neuro: alert and oriented x 3 Psych: normal mood and affect Rectal exam: Increased anal sphincter tone, positive anal fissure at 3'O clock with tenderness.  No external hemorrhoids Anoscopy was not performed due to anal discomfort in the setting of fissure  Data Reviewed:  Reviewed labs, radiology imaging, old records and pertinent past GI work up   Assessment and Plan/Recommendations: 47 year old female with history of chronic intermittent constipation and anorectal discomfort likely secondary to anal fissure Advised patient to increase dietary fiber and fluid intake Start Benefiber 1 tablespoon 3 times daily with meals Continue Linzess 145 mcg daily Apply 0.125% nitroglycerin small pea-sized amount per rectum 3 times daily for 2-3 months If continues to have persistent symptoms, will consider colonoscopy for further evaluation Return in 2 months or sooner if needed    K. Denzil Magnuson , MD [phone] Mon-Fri 8a-5p [phone] after 5p, weekends, holidays  CC: Dorena Dew, FNP

## 2017-10-23 MED FILL — $LINZESS 145MCG CAPSULE: 145 | 30 days supply | Qty: 30 | Fill #3

## 2017-11-30 ENCOUNTER — Ambulatory Visit: Payer: No Typology Code available for payment source | Admitting: Family Medicine

## 2017-12-27 ENCOUNTER — Ambulatory Visit: Payer: Medicaid Other | Admitting: Physician Assistant

## 2017-12-27 ENCOUNTER — Encounter: Payer: Self-pay | Admitting: Physician Assistant

## 2017-12-27 ENCOUNTER — Other Ambulatory Visit: Payer: Self-pay | Admitting: Emergency Medicine

## 2017-12-27 VITALS — BP 100/68 | HR 94 | Ht 65.0 in | Wt 189.0 lb

## 2017-12-27 DIAGNOSIS — K602 Anal fissure, unspecified: Secondary | ICD-10-CM | POA: Diagnosis not present

## 2017-12-27 DIAGNOSIS — K5909 Other constipation: Secondary | ICD-10-CM

## 2017-12-27 DIAGNOSIS — K625 Hemorrhage of anus and rectum: Secondary | ICD-10-CM | POA: Diagnosis not present

## 2017-12-27 DIAGNOSIS — K5900 Constipation, unspecified: Secondary | ICD-10-CM | POA: Diagnosis not present

## 2017-12-27 DIAGNOSIS — K6289 Other specified diseases of anus and rectum: Secondary | ICD-10-CM | POA: Diagnosis not present

## 2017-12-27 MED ORDER — NA SULFATE-K SULFATE-MG SULF 17.5-3.13-1.6 GM/177ML PO SOLN: 1.0000 | ORAL | 0 refills | Status: DC

## 2017-12-27 MED ORDER — LINACLOTIDE 145 MCG PO CAPS: 145.0000 ug | ORAL_CAPSULE | Freq: Every day | ORAL | 3 refills | Status: DC

## 2017-12-27 MED FILL — SUPREP BOWEL PREP KIT: 17.5-3.13-1 | 1 days supply | Qty: 324 | Fill #0

## 2017-12-27 NOTE — Patient Instructions (Signed)

## 2017-12-27 NOTE — Progress Notes (Signed)
Chief Complaint: Follow-up incomplete evacuation and anorectal pain  HPI:    Kirsten Torres is a 47 year old Arabic speaking female who is accompanied by language interpreter, and returns to clinic today for follow-up of her complaint of incomplete evacuation and anorectal pain with defecation.    09/18/17 office visit Dr. Silverio Decamp.  Was taking Linzess and this helped her to have a bowel movement but she thought she was unable to completely evacuate and also had significant pain during defecation.  With intermittent bright red blood per rectum.  Rectal exam with increased anal sphincter tone, positive anal fissure at 3:00 with tenderness.  No external hemorrhoids.  Prescribed 0.125% nitroglycerin small pea-sized amount per rectum 3 times daily for 2-3 months, if continued with symptoms would recommend colonoscopy for further evaluation.  Also started on fiber and continued on Linzess 145 daily.    Today, explains that she uses her Linzess daily or every other day and this works for her to have a bowel movement and helps to "soften the stool", but every time she defecates the pain is still there.  Patient describes it as different from the pain of the fissure which she feels has healed with the use of Nitroglycerin.  Does describe that her stool "comes out in the wrong direction, to the sides", often the patient has to use her hand to press on the side of her rectum in order for it to come out correctly.  Associated lower back pain at time of bowel movement as well as LLQ pain.  Denies any further rectal bleeding.    Denies fever, chills, weight loss, nausea, vomiting or symptoms that awaken her at night.  Past Medical History:  Diagnosis Date  . Genital mutilation, female   . GERD (gastroesophageal reflux disease)   . SVD (spontaneous vaginal delivery) 05/28/2011    Past Surgical History:  Procedure Laterality Date  . genital mutilation    . TONSILLECTOMY  2004    Current Outpatient Medications    Medication Sig Dispense Refill  . AMBULATORY NON FORMULARY MEDICATION Medication Name: Nitroglycerin 0.125 % ointment , apply pea size amount to rectum three times a day x 2-3 months 30 g 1  . calcium carbonate (TUMS - DOSED IN MG ELEMENTAL CALCIUM) 500 MG chewable tablet Chew 1 tablet by mouth daily.    . cyclobenzaprine (FLEXERIL) 5 MG tablet Take 1 tablet (5 mg total) by mouth at bedtime as needed for muscle spasms. 30 tablet 0  . linaclotide (LINZESS) 145 MCG CAPS capsule Take 1 capsule (145 mcg total) by mouth daily before breakfast. 90 capsule 3  . meloxicam (MOBIC) 7.5 MG tablet Take 1 tablet (7.5 mg total) by mouth daily. 30 tablet 0  . Naproxen Sodium (ALEVE PO) Take by mouth.    Marland Kitchen omeprazole (PRILOSEC) 20 MG capsule TAKE 1 CAPSULE BY MOUTH DAILY. 42 capsule 1   No current facility-administered medications for this visit.     Allergies as of 12/27/2017  . (No Known Allergies)    No family history on file.  Social History   Socioeconomic History  . Marital status: Married    Spouse name: Not on file  . Number of children: 2  . Years of education: master's  . Highest education level: Not on file  Occupational History  . Occupation: homemaker  Social Needs  . Financial resource strain: Not on file  . Food insecurity:    Worry: Not on file    Inability: Not on file  .  Transportation needs:    Medical: Not on file    Non-medical: Not on file  Tobacco Use  . Smoking status: Never Smoker  . Smokeless tobacco: Never Used  Substance and Sexual Activity  . Alcohol use: No  . Drug use: No  . Sexual activity: Yes    Partners: Male    Birth control/protection: None    Comment: stopped COC to become pregnant  Lifestyle  . Physical activity:    Days per week: Not on file    Minutes per session: Not on file  . Stress: Not on file  Relationships  . Social connections:    Talks on phone: Not on file    Gets together: Not on file    Attends religious service: Not on  file    Active member of club or organization: Not on file    Attends meetings of clubs or organizations: Not on file    Relationship status: Not on file  . Intimate partner violence:    Fear of current or ex partner: Not on file    Emotionally abused: Not on file    Physically abused: Not on file    Forced sexual activity: Not on file  Other Topics Concern  . Not on file  Social History Narrative   From [place]. Came to the [place] in 2011. Master's Degree in [employer].   Lives with her husband (came here from [place] in 2004) and their 2 children.    Review of Systems:    Constitutional: No weight loss, fever or chills Cardiovascular: No chest pain  Respiratory: No SOB  Gastrointestinal: See HPI and otherwise negative   Physical Exam:  Vital signs: BP 100/68   Pulse 94   Ht 5\' 5"  (1.651 m)   Wt 189 lb (85.7 kg)   BMI 31.45 kg/m   Constitutional:   Pleasant female appears to be in NAD, Well developed, Well nourished, alert and cooperative Respiratory: Respirations even and unlabored. Lungs clear to auscultation bilaterally.   No wheezes, crackles, or rhonchi.  Cardiovascular: Normal S1, S2. No MRG. Regular rate and rhythm. No peripheral edema, cyanosis or pallor.  Gastrointestinal:  Soft, nondistended, mild LLQ ttp, No rebound or guarding. Normal bowel sounds. No appreciable masses or hepatomegaly. Rectal:  Not performed.  Psychiatric: Demonstrates good judgement and reason without abnormal affect or behaviors.  No recent labs or imaging.  Assessment: 1. Anal Fissure: Describes pain from this is better, continues with rectal pain at time of bowel movements though; consider stricture versus diverticula versus other 2. Rectal pain: See above 3. Rectal bleeding: Better after Nitroglycerin 4.  Constipation: Some better with Linzess, stool does not come out in the "right direction" and she has to put her hand near her rectum to help; consider rectocele?  Plan: 1.  Per Dr.  Woodward Ku recommendations.  Will schedule patient for a colonoscopy today in the [place].  Did discuss risk, benefits, limitations and alternatives and patient agrees to proceed. 2.  Patient to continue Linzess 145 mcg for now. 3.  Patient to follow in clinic per recommendations with Dr. Silverio Decamp after time of procedure.  Kirsten Newer, PA-C [facility] 12/27/2017, 9:28 AM  Cc: Dorena Dew, FNP

## 2017-12-28 MED FILL — $LINZESS 145MCG CAPSULE: 145 | 30 days supply | Qty: 30 | Fill #4

## 2017-12-28 NOTE — Progress Notes (Signed)
Please tell patient to conintue Nitro x6-8 weeks longer. -may need refill. Thanks-JLL

## 2017-12-28 NOTE — Progress Notes (Signed)
Please advise patient to continue Rectal Nitroglycerine 0.125% three times daily for additional 6-8 weeks given persistent symptoms. Will proceed with colonoscopy Reviewed and agree with documentation and assessment and plan. Damaris Hippo , MD

## 2018-01-01 ENCOUNTER — Telehealth: Payer: Self-pay

## 2018-01-01 NOTE — Telephone Encounter (Signed)
Levin Erp, PA (Physician Assistant)       [] Hide copied text  [] Hover for details   Please tell patient to conintue Nitro x6-8 weeks longer. -may need refill. Thanks-JLL

## 2018-01-01 NOTE — Telephone Encounter (Signed)
The pt husband was notified due to language barrier.  He will notify pharmacy if refill is needed.

## 2018-01-01 NOTE — Telephone Encounter (Signed)
-----   Message from Levin Erp, [place] sent at 12/28/2017 12:13 PM EDT -----   ----- Message ----- From: Mauri Pole, MD Sent: 12/28/2017   9:00 AM To: Levin Erp, PA    ----- Message ----- From: Levin Erp, [place] Sent: 12/27/2017  10:14 AM To: Mauri Pole, MD

## 2018-01-03 ENCOUNTER — Ambulatory Visit (AMBULATORY_SURGERY_CENTER): Payer: Medicaid Other | Admitting: Gastroenterology

## 2018-01-03 ENCOUNTER — Encounter: Payer: Self-pay | Admitting: Gastroenterology

## 2018-01-03 ENCOUNTER — Other Ambulatory Visit: Payer: Self-pay

## 2018-01-03 VITALS — BP 110/70 | HR 78 | Temp 98.4°F | Resp 11 | Ht 65.0 in | Wt 189.0 lb

## 2018-01-03 DIAGNOSIS — K602 Anal fissure, unspecified: Secondary | ICD-10-CM

## 2018-01-03 DIAGNOSIS — K6289 Other specified diseases of anus and rectum: Secondary | ICD-10-CM | POA: Diagnosis not present

## 2018-01-03 DIAGNOSIS — K625 Hemorrhage of anus and rectum: Secondary | ICD-10-CM

## 2018-01-03 MED ORDER — SODIUM CHLORIDE 0.9 % IV SOLN: 500.0000 mL | Freq: Once | INTRAVENOUS | Status: DC

## 2018-01-03 NOTE — Patient Instructions (Signed)
*  Handouts given for banding and internal hemorrhoids.  YOU HAD AN ENDOSCOPIC PROCEDURE TODAY AT [facility]:   Refer to the procedure report that was given to you for any specific questions about what was found during the examination.  If the procedure report does not answer your questions, please call your gastroenterologist to clarify.  If you requested that your care partner not be given the details of your procedure findings, then the procedure report has been included in a sealed envelope for you to review at your convenience later.  YOU SHOULD EXPECT: Some feelings of bloating in the abdomen. Passage of more gas than usual.  Walking can help get rid of the air that was put into your GI tract during the procedure and reduce the bloating. If you had a lower endoscopy (such as a colonoscopy or flexible sigmoidoscopy) you may notice spotting of blood in your stool or on the toilet paper. If you underwent a bowel prep for your procedure, you may not have a normal bowel movement for a few days.  Please Note:  You might notice some irritation and congestion in your nose or some drainage.  This is from the oxygen used during your procedure.  There is no need for concern and it should clear up in a day or so.  SYMPTOMS TO REPORT IMMEDIATELY:   Following lower endoscopy (colonoscopy or flexible sigmoidoscopy):  Excessive amounts of blood in the stool  Significant tenderness or worsening of abdominal pains  Swelling of the abdomen that is new, acute  Fever of 100F or higher   For urgent or emergent issues, a gastroenterologist can be reached at any hour by calling [phone].   DIET:  We do recommend a small meal at first, but then you may proceed to your regular diet.  Drink plenty of fluids but you should avoid alcoholic beverages for 24 hours.  ACTIVITY:  You should plan to take it easy for the rest of today and you should NOT DRIVE or use heavy machinery until tomorrow  (because of the sedation medicines used during the test).    FOLLOW UP: Our staff will call the number listed on your records the next business day following your procedure to check on you and address any questions or concerns that you may have regarding the information given to you following your procedure. If we do not reach you, we will leave a message.  However, if you are feeling well and you are not experiencing any problems, there is no need to return our call.  We will assume that you have returned to your regular daily activities without incident.  If any biopsies were taken you will be contacted by phone or by letter within the next 1-3 weeks.  Please call us at [phone] if you have not heard about the biopsies in 3 weeks.    SIGNATURES/CONFIDENTIALITY: You and/or your care partner have signed paperwork which will be entered into your electronic medical record.  These signatures attest to the fact that that the information above on your After Visit Summary has been reviewed and is understood.  Full responsibility of the confidentiality of this discharge information lies with you and/or your care-partner.

## 2018-01-03 NOTE — Progress Notes (Signed)
No egg or soy allergy  Pt's states no medical or surgical changes since previsit or office visit. 

## 2018-01-03 NOTE — Op Note (Signed)
[facility] Patient Name: Kirsten Torres Procedure Date: 01/03/2018 10:55 AM MRN: [id number] Endoscopist: Mauri Pole , MD Age: 47 Referring MD:  Date of Birth: May 03, 1971 Gender: Female Account #: [id number] Procedure:                Colonoscopy Indications:              Evaluation of unexplained GI bleeding Medicines:                Monitored Anesthesia Care Procedure:                Pre-Anesthesia Assessment:                           - Prior to the procedure, a History and Physical                            was performed, and patient medications and                            allergies were reviewed. The patient's tolerance of                            previous anesthesia was also reviewed. The risks                            and benefits of the procedure and the sedation                            options and risks were discussed with the patient.                            All questions were answered, and informed consent                            was obtained. Prior Anticoagulants: The patient has                            taken no previous anticoagulant or antiplatelet                            agents. ASA Grade Assessment: II - A patient with                            mild systemic disease. After reviewing the risks                            and benefits, the patient was deemed in                            satisfactory condition to undergo the procedure.                           After obtaining informed consent, the colonoscope  was passed under direct vision. Throughout the                            procedure, the patient's blood pressure, pulse, and                            oxygen saturations were monitored continuously. The                            Colonoscope was introduced through the anus and                            advanced to the the cecum, identified by                            appendiceal orifice and  ileocecal valve. The                            colonoscopy was performed without difficulty. The                            patient tolerated the procedure well. The quality                            of the bowel preparation was excellent. The                            ileocecal valve, appendiceal orifice, and rectum                            were photographed. Scope In: 10:58:37 AM Scope Out: 11:13:07 AM Scope Withdrawal Time: 0 hours 7 minutes 12 seconds  Total Procedure Duration: 0 hours 14 minutes 30 seconds  Findings:                 The perianal and digital rectal examinations were                            normal.                           Non-bleeding internal hemorrhoids were found during                            retroflexion. The hemorrhoids were small.                           The exam was otherwise without abnormality. Complications:            No immediate complications. Estimated Blood Loss:     Estimated blood loss: none. Impression:               - Non-bleeding internal hemorrhoids.                           - The examination was otherwise normal.                           -  No specimens collected. Recommendation:           - Patient has a contact number available for                            emergencies. The signs and symptoms of potential                            delayed complications were discussed with the                            patient. Return to normal activities tomorrow.                            Written discharge instructions were provided to the                            patient.                           - Resume previous diet.                           - Continue present medications.                           - Repeat colonoscopy in 10 years for screening                            purposes. Mauri Pole, MD 01/03/2018 11:16:26 AM This report has been signed electronically.

## 2018-01-03 NOTE — Progress Notes (Signed)
Report given to PACU, vss 

## 2018-01-04 ENCOUNTER — Telehealth: Payer: Self-pay

## 2018-01-04 ENCOUNTER — Telehealth: Payer: Self-pay | Admitting: *Deleted

## 2018-01-04 NOTE — Telephone Encounter (Signed)
  Follow up Call-  Call back number 01/03/2018  Post procedure Call Back phone  # [phone]  Permission to leave phone message Yes  Some recent data might be hidden     Patient questions:  Do you have a fever, pain , or abdominal swelling? No. Pain Score  0 *  Have you tolerated food without any problems? Yes.    Have you been able to return to your normal activities? Yes.    Do you have any questions about your discharge instructions: Diet   No. Medications  No. Follow up visit  No.  Do you have questions or concerns about your Care? No.  Spoke to pt.'s husband.  He reports she is back to normal.  Actions: * If pain score is 4 or above: No action needed, pain <4.

## 2018-01-04 NOTE — Telephone Encounter (Signed)
No answer. Left message to call if questions or concerns. 

## 2018-02-19 ENCOUNTER — Telehealth: Payer: Self-pay | Admitting: Internal Medicine

## 2018-02-19 NOTE — Telephone Encounter (Signed)
Contacted by patient's husband.  Wife has seen Dr. Silverio Decamp, colonoscopy in April 2019. Now having rectal pain and bleeding.   Occurring 2 x today.  Small volume overall.  Bright red bleeding. 2 days of constipation.  She is using the Linzess daily.   colonoscopy reviewed.  Bleeding sounds most likely hemorrhoidal in nature given her history. I let her husband know that if she has significant or large volume, additional rectal bleeding tonight, abdominal pain, fever that she needs to be seen in the ER.  Otherwise will defer to Dr. Silverio Decamp who can follow-up with the patient tomorrow.

## 2018-02-20 ENCOUNTER — Other Ambulatory Visit: Payer: Self-pay

## 2018-02-20 ENCOUNTER — Telehealth: Payer: Self-pay | Admitting: Internal Medicine

## 2018-02-20 MED ORDER — HYDROCORTISONE 2.5 % RE CREA: 1.0000 "application " | TOPICAL_CREAM | Freq: Every day | RECTAL | 1 refills | Status: DC

## 2018-02-20 NOTE — Telephone Encounter (Signed)
Beth, please advise patient to use benefiber 1 tablespoon TID with meals. Preparation H at bedtime as needed 5-7 days. Follow up in office if bleeding persists. Thanks

## 2018-02-20 NOTE — Telephone Encounter (Signed)
If I use Rx formula, she will be able to pick it up from her pharmacy using her insurance.

## 2018-02-20 NOTE — Telephone Encounter (Signed)
Pt's husband Mr. Katharina Caper returned your call. Pls call him again.

## 2018-02-20 NOTE — Telephone Encounter (Signed)
Left message to call back  

## 2018-02-21 MED FILL — PROCTOZONE-HC 2.5 % CREA: 2.5 | 30 days supply | Qty: 30 | Fill #0

## 2018-02-21 NOTE — Telephone Encounter (Signed)
Ok thanks 

## 2018-04-02 ENCOUNTER — Other Ambulatory Visit: Payer: Self-pay | Admitting: Family Medicine

## 2018-04-02 DIAGNOSIS — K5909 Other constipation: Secondary | ICD-10-CM

## 2018-04-02 MED FILL — OMEPRAZOLE 20 MG CAP: 20 | 24 days supply | Qty: 24 | Fill #2

## 2018-04-10 ENCOUNTER — Other Ambulatory Visit: Payer: Self-pay | Admitting: Family Medicine

## 2018-04-10 DIAGNOSIS — K5909 Other constipation: Secondary | ICD-10-CM

## 2018-04-12 ENCOUNTER — Ambulatory Visit (INDEPENDENT_AMBULATORY_CARE_PROVIDER_SITE_OTHER): Payer: Medicaid Other | Admitting: Family Medicine

## 2018-04-12 ENCOUNTER — Encounter: Payer: Self-pay | Admitting: Family Medicine

## 2018-04-12 VITALS — BP 108/70 | HR 102 | Temp 98.1°F | Resp 16 | Ht 65.0 in | Wt 190.0 lb

## 2018-04-12 DIAGNOSIS — M62838 Other muscle spasm: Secondary | ICD-10-CM | POA: Diagnosis not present

## 2018-04-12 DIAGNOSIS — M542 Cervicalgia: Secondary | ICD-10-CM

## 2018-04-12 DIAGNOSIS — K5909 Other constipation: Secondary | ICD-10-CM | POA: Diagnosis not present

## 2018-04-12 DIAGNOSIS — Z0000 Encounter for general adult medical examination without abnormal findings: Secondary | ICD-10-CM

## 2018-04-12 DIAGNOSIS — M5030 Other cervical disc degeneration, unspecified cervical region: Secondary | ICD-10-CM

## 2018-04-12 MED ORDER — LINACLOTIDE 145 MCG PO CAPS: 145.0000 ug | ORAL_CAPSULE | Freq: Every day | ORAL | 1 refills | Status: DC

## 2018-04-12 MED ORDER — MELOXICAM 7.5 MG PO TABS: 7.5000 mg | ORAL_TABLET | Freq: Every day | ORAL | 2 refills | Status: DC

## 2018-04-12 MED ORDER — MELOXICAM 15 MG PO TABS: 15.0000 mg | ORAL_TABLET | Freq: Every day | ORAL | 1 refills | Status: DC | PRN

## 2018-04-12 MED ORDER — CYCLOBENZAPRINE HCL 5 MG PO TABS: 5.0000 mg | ORAL_TABLET | Freq: Every evening | ORAL | 0 refills | Status: DC | PRN

## 2018-04-12 MED FILL — CYCLOBENZAPRINE 5 MG TABLET: 5 | 30 days supply | Qty: 30 | Fill #0

## 2018-04-12 MED FILL — MELOXICAM 15 MG TABLET: 15 | 30 days supply | Qty: 30 | Fill #0

## 2018-04-12 MED FILL — LINZESS 145 MCG CAPSULE: 145 | 30 days supply | Qty: 30 | Fill #0

## 2018-04-12 NOTE — Progress Notes (Signed)
SUBJECTIVE:  Kirsten Torres is a 47 y.o. female who complains of neck pain that has not been caused by an injury. Patient has been seen for this in the past approx 7 months prior. States relief with mobic and flexeril. Would like referral to PT.  The pain is positional with movement of neck without radiation of pain down the arms. Prior history of neck problems: no prior neck problems and recurrent self limited episodes of neck pain in the past. There is not numbness, tingling, weakness in the arms. Patient would like to be referred to GYN for her well woman exams. Also needs refills on linzess due to hx of anal fissures.   OBJECTIVE: BP 108/70 (BP Location: Left Arm, Patient Position: Sitting, Cuff Size: Normal)   Pulse (!) 102   Temp 98.1 F (36.7 C) (Oral)   Resp 16   Ht 5\' 5"  (1.651 m)   Wt 190 lb (86.2 kg)   LMP 03/27/2018   SpO2 100%   BMI 31.62 kg/m   Vital signs as noted above. Patient appears to be in mild to moderate pain.  Physical Exam  Constitutional: She is oriented to person, place, and time and well-developed, well-nourished, and in no distress.  Eyes: Pupils are equal, round, and reactive to light. Conjunctivae and EOM are normal.  Neck: Spinous process tenderness and muscular tenderness (right side) present. Decreased range of motion present.  Cardiovascular: Normal rate, regular rhythm and normal heart sounds.  Pulmonary/Chest: Effort normal and breath sounds normal.  Lymphadenopathy:    She has no cervical adenopathy.  Neurological: She is alert and oriented to person, place, and time. No cranial nerve deficit. Coordination normal.  Skin: Skin is warm and dry.  Psychiatric: Mood, memory, affect and judgment normal.  Nursing note reviewed.  Neck exam: tenderness over lower cervical spine and nuchal area, tenderness over trapezial muscles, normal neurological exam of arms; normal DTR's, motor, sensory exam.Limited ROM due to pain.  X-Ray:Straightened alignment.   Mild degenerative disc disease at C5-6.  ASSESSMENT/ PLAN:  rest the injured area as much as practical, apply heat (warned not to sleep on heating pad), prescription for NSAID given, referral to Physical Therapy Consider Physical Therapy and XRay studies if not improving.  Call or return to clinic prn if these symptoms worsen or fail to improve as anticipated. 1. Neck pain on right side - meloxicam (MOBIC) 15 MG tablet; Take 1 tablet (15 mg total) by mouth daily as needed for pain.  Dispense: 30 tablet; Refill: 1 - Ambulatory referral to Physical Therapy  2. Degenerative disc disease, cervical - cyclobenzaprine (FLEXERIL) 5 MG tablet; Take 1 tablet (5 mg total) by mouth at bedtime as needed for muscle spasms.  Dispense: 30 tablet; Refill: 0 - meloxicam (MOBIC) 15 MG tablet; Take 1 tablet (15 mg total) by mouth daily as needed for pain.  Dispense: 30 tablet; Refill: 1  3. Neck muscle spasm - cyclobenzaprine (FLEXERIL) 5 MG tablet; Take 1 tablet (5 mg total) by mouth at bedtime as needed for muscle spasms.  Dispense: 30 tablet; Refill: 0  4. Chronic constipation Refilled medications - linaclotide (LINZESS) 145 MCG CAPS capsule; Take 1 capsule (145 mcg total) by mouth daily before breakfast.  Dispense: 90 capsule; Refill: 1

## 2018-04-12 NOTE — Patient Instructions (Addendum)
I have refilled your medications and made a referral to physical therapy. I also placed a referral to GYN as you requested.    Cervical Strain and Sprain Rehab Ask your health care provider which exercises are safe for you. Do exercises exactly as told by your health care provider and adjust them as directed. It is normal to feel mild stretching, pulling, tightness, or discomfort as you do these exercises, but you should stop right away if you feel sudden pain or your pain gets worse.Do not begin these exercises until told by your health care provider. Stretching and range of motion exercises These exercises warm up your muscles and joints and improve the movement and flexibility of your neck. These exercises also help to relieve pain, numbness, and tingling. Exercise A: Cervical side bend  1. Using good posture, sit on a stable chair or stand up. 2. Without moving your shoulders, slowly tilt your left / right ear to your shoulder until you feel a stretch in your neck muscles. You should be looking straight ahead. 3. Hold for __________ seconds. 4. Repeat with the other side of your neck. Repeat __________ times. Complete this exercise __________ times a day. Exercise B: Cervical rotation  1. Using good posture, sit on a stable chair or stand up. 2. Slowly turn your head to the side as if you are looking over your left / right shoulder. ? Keep your eyes level with the ground. ? Stop when you feel a stretch along the side and the back of your neck. 3. Hold for __________ seconds. 4. Repeat this by turning to your other side. Repeat __________ times. Complete this exercise __________ times a day. Exercise C: Thoracic extension and pectoral stretch 1. Roll a towel or a small blanket so it is about 4 inches (10 cm) in diameter. 2. Lie down on your back on a firm surface. 3. Put the towel lengthwise, under your spine in the middle of your back. It should not be not under your shoulder blades.  The towel should line up with your spine from your middle back to your lower back. 4. Put your hands behind your head and let your elbows fall out to your sides. 5. Hold for __________ seconds. Repeat __________ times. Complete this exercise __________ times a day. Strengthening exercises These exercises build strength and endurance in your neck. Endurance is the ability to use your muscles for a long time, even after your muscles get tired. Exercise D: Upper cervical flexion, isometric 1. Lie on your back with a thin pillow behind your head and a small rolled-up towel under your neck. 2. Gently tuck your chin toward your chest and nod your head down to look toward your feet. Do not lift your head off the pillow. 3. Hold for __________ seconds. 4. Release the tension slowly. Relax your neck muscles completely before you repeat this exercise. Repeat __________ times. Complete this exercise __________ times a day. Exercise E: Cervical extension, isometric  1. Stand about 6 inches (15 cm) away from a wall, with your back facing the wall. 2. Place a soft object, about 6-8 inches (15-20 cm) in diameter, between the back of your head and the wall. A soft object could be a small pillow, a ball, or a folded towel. 3. Gently tilt your head back and press into the soft object. Keep your jaw and forehead relaxed. 4. Hold for __________ seconds. 5. Release the tension slowly. Relax your neck muscles completely before you repeat this  exercise. Repeat __________ times. Complete this exercise __________ times a day. Posture and body mechanics  Body mechanics refers to the movements and positions of your body while you do your daily activities. Posture is part of body mechanics. Good posture and healthy body mechanics can help to relieve stress in your body's tissues and joints. Good posture means that your spine is in its natural S-curve position (your spine is neutral), your shoulders are pulled back  slightly, and your head is not tipped forward. The following are general guidelines for applying improved posture and body mechanics to your everyday activities. Standing  When standing, keep your spine neutral and keep your feet about hip-width apart. Keep a slight bend in your knees. Your ears, shoulders, and hips should line up.  When you do a task in which you stand in one place for a long time, place one foot up on a stable object that is 2-4 inches (5-10 cm) high, such as a footstool. This helps keep your spine neutral. Sitting   When sitting, keep your spine neutral and your keep feet flat on the floor. Use a footrest, if necessary, and keep your thighs parallel to the floor. Avoid rounding your shoulders, and avoid tilting your head forward.  When working at a desk or a computer, keep your desk at a height where your hands are slightly lower than your elbows. Slide your chair under your desk so you are close enough to maintain good posture.  When working at a computer, place your monitor at a height where you are looking straight ahead and you do not have to tilt your head forward or downward to look at the screen. Resting When lying down and resting, avoid positions that are most painful for you. Try to support your neck in a neutral position. You can use a contour pillow or a small rolled-up towel. Your pillow should support your neck but not push on it. This information is not intended to replace advice given to you by your health care provider. Make sure you discuss any questions you have with your health care provider. Document Released: 08/29/2005 Document Revised: 05/05/2016 Document Reviewed: 08/05/2015 Elsevier Interactive Patient Education  [employer].

## 2018-05-31 ENCOUNTER — Other Ambulatory Visit: Payer: Self-pay

## 2018-05-31 ENCOUNTER — Ambulatory Visit: Payer: Medicaid Other | Attending: Family Medicine | Admitting: Physical Therapy

## 2018-05-31 DIAGNOSIS — M542 Cervicalgia: Secondary | ICD-10-CM | POA: Diagnosis not present

## 2018-05-31 DIAGNOSIS — R293 Abnormal posture: Secondary | ICD-10-CM | POA: Diagnosis not present

## 2018-05-31 DIAGNOSIS — M6281 Muscle weakness (generalized): Secondary | ICD-10-CM | POA: Insufficient documentation

## 2018-05-31 NOTE — Therapy (Signed)
[place], [place], [address] Phone: [phone]   Fax:  [phone]  Physical Therapy Evaluation  Patient Details  Name: Kirsten Torres MRN: [id number] Date of Birth: [birth year] Referring Provider: Cyndia Skeeters FNP   Encounter Date: 05/31/2018  PT End of Session - 05/31/18 1148    Visit Number  1    Number of Visits  4    Date for PT Re-Evaluation  06/21/18    Authorization Type  MCD  first authorization period 4 visit    Authorization - Visit Number  1    Authorization - Number of Visits  4    PT Start Time  [phone]    PT Stop Time  1018    PT Time Calculation (min)  44 min    Activity Tolerance  Patient tolerated treatment well    Behavior During Therapy  [facility] for tasks assessed/performed       Past Medical History:  Diagnosis Date  . Genital mutilation, female   . GERD (gastroesophageal reflux disease)   . SVD (spontaneous vaginal delivery) 05/28/2011    Past Surgical History:  Procedure Laterality Date  . genital mutilation    . TONSILLECTOMY  2004    There were no vitals filed for this visit.   Subjective Assessment - 05/31/18 0938    Subjective  Pt reports through interpreter reports 5 years of pain in neck . but in 2017 had x ray with narrowing of vertebrae.  Pain has effected hand with tingling and numbness     Patient is accompained by:  Family member;Interpreter   husband   Limitations  House hold activities;Walking;Standing;Sitting    How long can you sit comfortably?  1 hour    How long can you stand comfortably?  1 hour    How long can you walk comfortably?  1 hour    Diagnostic tests  x ray in 2017 with narrowing     Patient Stated Goals  relieve pain, do household chores     Currently in Pain?  Yes    Pain Score  8     Pain Location  Neck    Pain Orientation  Right    Pain Descriptors / Indicators  Tingling;Numbness;Aching    Pain Radiating Towards  radiating to hand  to C-5 6 distribution    Pain Onset  More than a month ago    Pain Frequency  Intermittent    Aggravating Factors   standing in kitchen washing dishes, laundry, hand gets weak and she feels like she cannot grip the handle, sleeping difficulty lying on right side, wakes up after 3 hours    Pain Relieving Factors  flexeril helps         [facility] PT Assessment - 05/31/18 0939      Assessment   Medical Diagnosis  neck pain in right    Referring Provider  Cyndia Skeeters FNP    Onset Date/Surgical Date  01/28/18   worsened in last months but have had for 5 years   Hand Dominance  Left    Next MD Visit  not scheduled retrun in 6 months    Prior Therapy  none      Precautions   Precautions  None      Restrictions   Weight Bearing Restrictions  No      Balance Screen   Has the patient fallen in the past 6 months  No    Has the patient  had a decrease in activity level because of a fear of falling?   No    Is the patient reluctant to leave their home because of a fear of falling?   No      Home Film/video editor residence    Living Arrangements  Spouse/significant other;Children    Type of [place]  One level      Cognition   Overall Cognitive Status  Within Functional Limits for tasks assessed      Observation/Other Assessments   Focus on Therapeutic Outcomes (FOTO)   FOTO not taken      Sensation   Light Touch  Appears Intact    Stereognosis  Appears Intact    Hot/Cold  Appears Intact    Proprioception  Appears Intact    Additional Comments  pt reports numbness and tingling in C5,6 distributions      ROM / Strength   AROM / PROM / Strength  AROM;Strength      AROM   Right Shoulder Flexion  150 Degrees    Right Shoulder ABduction  120 Degrees   aggravates neck   Left Shoulder Flexion  160 Degrees    Left Shoulder ABduction  152 Degrees    Cervical Flexion  40   initial pain with movement   Cervical Extension  48   initial  with movement   Cervical - Right Side Bend  35   pain with movement   Cervical - Left Side Bend  54    Cervical - Right Rotation  38   pain with  movement   Cervical - Left Rotation  52      Strength   Overall Strength  Deficits    Overall Strength Comments  supine head flexion pt immediately compensates with SCM muscle holds for 0 secs    Right Shoulder Flexion  4/5   inhibited due to pain in right neck   Right Shoulder ABduction  4/5   inhibited due to pain inright neck   Left Shoulder Flexion  5/5    Left Shoulder ABduction  5/5    Right Hand Grip (lbs)  35   38, 30, 37   Left Hand Grip (lbs)  40.6   40, 42, 40     Palpation   Palpation comment  pt with muscle knot on right upper trap and tense right cervcial paraspinals on right                Objective measurements completed on examination: See above findings.      [facility] Adult PT Treatment/Exercise - 05/31/18 0001      Self-Care   Self-Care  Posture    Posture  intial posture sitting and standing       Neck Exercises: Stretches   Upper Trapezius Stretch  3 reps;Right;30 seconds    Upper Trapezius Stretch Limitations  vc and tc    Levator Stretch  Right;3 reps;30 seconds    Levator Stretch Limitations  vc and tc    Other Neck Stretches  neck retraction 10 sec hold x 5    Other Neck Stretches  shoulder retraction x 10             PT Education - 05/31/18 0942    Education Details  POC explanation of findings   initial posture and initial HEP    Person(s) Educated  Patient;Spouse;Other (comment)   interpreters   Methods  Explanation;Demonstration;Tactile  cues;Verbal cues;Handout    Comprehension  Verbalized understanding;Returned demonstration       PT Short Term Goals - 05/31/18 0951      PT SHORT TERM GOAL #1   Title  Pt will be independent with initial HEP    Baseline  no knowlledge    Time  3    Period  Weeks    Status  New    Target Date  06/21/18      PT SHORT TERM GOAL #2    Title  Pt will be able to sleep with 50% greater comfort on right side at night for sleep    Baseline  Pt unable to intially sleep , unable to sleepon right side can only sleep up to 3 hours at night    Time  3    Period  Weeks    Status  New    Target Date  06/21/18      PT SHORT TERM GOAL #3   Title  Pt will increase right cervical side bend and rotation 10 degrees to increase comfort while doing household chores    Baseline  right side bend 35 and rotation 38 at eval    Time  3    Period  Weeks    Status  New    Target Date  06/21/18        PT Long Term Goals - 05/31/18 1137      PT LONG TERM GOAL #1   Title  To be reassessed at 4th visit and make LTG's             Plan - 05/31/18 1137    Clinical Impression Statement  47 yo female presents with husband and female interpreter with complaint of 5 years of neck pain and recent exacerbation with tingling and numbness into C-5 and 6 distribution of right hand.  Pt reports that she has sometimes trouble gripping her laundry basket.  Pt hand grip  left is 40.6 lb and right 35 lb with left hand dominance.  Pt has impairments in posture, strength, AROM and pain . Pt would benefit from skilled PT  to address impairments  which hinder her from completing her household chores and sleeping at night. with right radiating pain from neck/ cervicalgia    Clinical Presentation  Stable    Clinical Decision Making  Low    Rehab Potential  Good    PT Frequency  1x / week   3 vists before 06-21-18 for MCD first authorization   PT Duration  3 weeks    PT Treatment/Interventions  ADLs/Self Care Home Management;Cryotherapy;Electrical Stimulation;Iontophoresis 4mg /ml Dexamethasone;Moist Heat;Traction;Ultrasound;Therapeutic exercise;Therapeutic activities;Neuromuscular re-education;Patient/family education;Manual techniques;Passive range of motion;Dry needling;Taping;Joint Manipulations    PT Next Visit Plan  Reveiw initial HEP and progress chin  tuck, deep neck flexors, manual    PT Home Exercise Plan  neck stretchs, shoulder retraction and neck retraction    Consulted and Agree with Plan of Care  Patient;Family member/caregiver;Other (Comment)   husband and interpreter      Patient will benefit from skilled therapeutic intervention in order to improve the following deficits and impairments:  Pain, Postural dysfunction, Improper body mechanics, Impaired UE functional use, Increased muscle spasms, Decreased strength, Decreased range of motion  Visit Diagnosis: Cervicalgia  Muscle weakness (generalized)  Abnormal posture     Problem List Patient Active Problem List   Diagnosis Date Noted  . Chronic constipation 01/19/2017  . Dysuria 01/19/2017  . Generalized abdominal pain 01/19/2017  .  Pain in the chest 11/16/2015  . Gastroesophageal reflux disease without esophagitis 11/16/2015  . Other fatigue 11/16/2015    Voncille Lo, PT Certified Exercise Expert for the Aging Adult  05/31/18 11:50 AM Phone: [phone] Fax: [facility] [address] [place], [place], [address] Phone: [phone]   Fax:  [phone]  Name: Kirsten Torres MRN: [id number] Date of Birth: [birth year]

## 2018-05-31 NOTE — Patient Instructions (Addendum)
Posture Tips DO: - stand tall and erect - keep chin tucked in - keep head and shoulders in alignment - check posture regularly in mirror or large window - pull head back against headrest in car seat;  Change your position often.  Sit with lumbar support. DON'T: - slouch or slump while watching TV or reading - sit, stand or lie in one position  for too long;  Sitting is especially hard on the spine so if you sit at a desk/use the computer, then stand up often!   Copyright  VHI. All rights reserved.  Posture - Standing   Good posture is important. Avoid slouching and forward head thrust. Maintain curve in low back and align ears over shoul- ders, hips over ankles.  Pull your belly button in toward your back bone. Stand with ribs lifted up .( golden thread to the sky)  With chin down.  Copyright  VHI. All rights reserved.  Posture - Sitting   Sit upright, head facing forward. Try using a roll to support lower back. Keep shoulders relaxed, and avoid rounded back. Keep hips level with knees. Avoid crossing legs for long periods. Sit on your sit bones and not your tail bone.  Copyright  VHI. All rights reserved.  Levator Stretch   Grasp seat or sit on hand on side to be stretched. Turn head toward other side and look down. Use hand on head to gently stretch neck in that position. Hold _30___ seconds. Repeat on other side. Repeat __3__ times. Do _2-3___ sessions per day.  http://gt2.exer.us/30   Copyright  VHI. All rights reserved.  Side-Bending   One hand on opposite side of head, pull head to side as far as is comfortable. Stop if there is pain. Hold __30__ seconds. Repeat with other hand to other side. Repeat _3___ times. Do _2-3___ sessions per day.   Copyright  VHI. All rights reserved.  Scapular Retraction (Standing)   With arms at sides, pinch shoulder blades together. Repeat _10___ times per set. Do _2___ sets per session. Do _1-2___ sessions per  day.  http://orth.exer.us/944   Copyright  VHI. All rights reserved.  Chin Protraction / Retraction   Slide head forward keeping chin level. Slide head back, pulling chin in. Hold each position _3__ seconds. Repeat _5__ times. Do _3__ sessions per day.  Copyright  VHI. All rights reserved.  Voncille Lo, PT Certified Exercise Expert for the Aging Adult  05/31/18 9:42 AM Phone: [phone] Fax: [phone]

## 2018-06-13 ENCOUNTER — Ambulatory Visit: Payer: Medicaid Other | Attending: Family Medicine | Admitting: Physical Therapy

## 2018-06-13 ENCOUNTER — Encounter: Payer: Self-pay | Admitting: Physical Therapy

## 2018-06-13 DIAGNOSIS — M542 Cervicalgia: Secondary | ICD-10-CM | POA: Insufficient documentation

## 2018-06-13 DIAGNOSIS — R293 Abnormal posture: Secondary | ICD-10-CM | POA: Diagnosis not present

## 2018-06-13 DIAGNOSIS — M6281 Muscle weakness (generalized): Secondary | ICD-10-CM | POA: Insufficient documentation

## 2018-06-13 NOTE — Therapy (Signed)
[place], [place], [address] Phone: [phone]   Fax:  [phone]  Physical Therapy Treatment  Patient Details  Name: Kirsten Torres MRN: [id number] Date of Birth: 09/24/1970 Referring Provider (PT): Cyndia Skeeters FNP   Encounter Date: 06/13/2018  PT End of Session - 06/13/18 0854    Visit Number  2    Number of Visits  4    Date for PT Re-Evaluation  06/21/18    Authorization Type  MCD  first authorization period 4 visit    Authorization - Visit Number  2    Authorization - Number of Visits  4    PT Start Time  0845    PT Stop Time  0928    PT Time Calculation (min)  43 min       Past Medical History:  Diagnosis Date  . Genital mutilation, female   . GERD (gastroesophageal reflux disease)   . SVD (spontaneous vaginal delivery) 05/28/2011    Past Surgical History:  Procedure Laterality Date  . genital mutilation    . TONSILLECTOMY  2004    There were no vitals filed for this visit.  Subjective Assessment - 06/13/18 0851    Subjective  Hep intermittently. No change overall.    Interpreter.    Currently in Pain?  Yes    Pain Location  Neck   upper trap   Pain Orientation  Right    Pain Descriptors / Indicators  Sore    Aggravating Factors   standing to wash dishes, laundry, sleep positons    Pain Relieving Factors  flexeril                        [facility] Adult PT Treatment/Exercise - 06/13/18 0001      Exercises   Exercises  Neck      Neck Exercises: Seated   W Back Limitations  scap squeeze with shoulder ER Yellow bilateral       Modalities   Modalities  Ultrasound      Ultrasound   Ultrasound Location  Right neck and upper trap    Ultrasound Parameters  1.2 w/cm2 100% 1 mhz x 8 min    Ultrasound Goals  Pain      Manual Therapy   Manual Therapy  Soft tissue mobilization    Soft tissue mobilization  Seated right upper trap Trigger Point release and soft  tissue work       Neck Exercises: Stretches   Upper Trapezius Stretch  3 reps;Right;30 seconds    Upper Trapezius Stretch Limitations  cues to hold longer     Levator Stretch  Right;3 reps;30 seconds    Levator Stretch Limitations  cues to hold longer     Other Neck Stretches  neck retraction 10 sec hold x 5    Other Neck Stretches  shoulder retraction x 10             PT Education - 06/13/18 0957    Education Details  HEP    Person(s) Educated  Patient    Methods  Explanation;Handout    Comprehension  Verbalized understanding       PT Short Term Goals - 05/31/18 0951      PT SHORT TERM GOAL #1   Title  Pt will be independent with initial HEP    Baseline  no knowlledge    Time  3    Period  Weeks  Status  New    Target Date  06/21/18      PT SHORT TERM GOAL #2   Title  Pt will be able to sleep with 50% greater comfort on right side at night for sleep    Baseline  Pt unable to intially sleep , unable to sleepon right side can only sleep up to 3 hours at night    Time  3    Period  Weeks    Status  New    Target Date  06/21/18      PT SHORT TERM GOAL #3   Title  Pt will increase right cervical side bend and rotation 10 degrees to increase comfort while doing household chores    Baseline  right side bend 35 and rotation 38 at eval    Time  3    Period  Weeks    Status  New    Target Date  06/21/18        PT Long Term Goals - 05/31/18 1137      PT LONG TERM GOAL #1   Title  To be reassessed at 4th visit and make LTG's            Plan - 06/13/18 0907    Clinical Impression Statement  Reviewed HEP. She can recall all exercises and performs with good technique however admits to not performing HEP everyday. Began grip strengthening, Gave theranband for scapular stabilization. Manual to right neck after continuous [place]. pt reprots feeling much better at end of session.     PT Next Visit Plan  Reveiw initial HEP and progress chin tuck, deep neck flexors,  manual    PT Home Exercise Plan  neck stretchs, shoulder retraction and neck retraction, yellow band scap retract with ER     Consulted and Agree with Plan of Care  Patient       Patient will benefit from skilled therapeutic intervention in order to improve the following deficits and impairments:  Pain, Postural dysfunction, Improper body mechanics, Impaired UE functional use, Increased muscle spasms, Decreased strength, Decreased range of motion  Visit Diagnosis: Cervicalgia  Muscle weakness (generalized)  Abnormal posture     Problem List Patient Active Problem List   Diagnosis Date Noted  . Chronic constipation 01/19/2017  . Dysuria 01/19/2017  . Generalized abdominal pain 01/19/2017  . Pain in the chest 11/16/2015  . Gastroesophageal reflux disease without esophagitis 11/16/2015  . Other fatigue 11/16/2015    [name], PTA 06/13/2018, 9:58 AM  [facility] [address] [place], [place], [address] Phone: [phone]   Fax:  [phone]  Name: Kirsten Torres MRN: [id number] Date of Birth: Sep 11, 1971

## 2018-06-15 ENCOUNTER — Ambulatory Visit: Payer: Medicaid Other | Admitting: Physical Therapy

## 2018-06-15 DIAGNOSIS — R293 Abnormal posture: Secondary | ICD-10-CM

## 2018-06-15 DIAGNOSIS — M542 Cervicalgia: Secondary | ICD-10-CM

## 2018-06-15 DIAGNOSIS — M6281 Muscle weakness (generalized): Secondary | ICD-10-CM | POA: Diagnosis not present

## 2018-06-15 NOTE — Therapy (Signed)
[place], [place], [address] Phone: [phone]   Fax:  [phone]  Physical Therapy Treatment  Patient Details  Name: Kirsten Torres MRN: [id number] Date of Birth: Mar 01, 1971 Referring Provider (PT): Cyndia Skeeters FNP   Encounter Date: 06/15/2018  PT End of Session - 06/15/18 1120    Visit Number  3    Number of Visits  4    Date for PT Re-Evaluation  06/21/18    Authorization Type  MCD  first authorization period 4 visit    Authorization - Visit Number  3    Authorization - Number of Visits  4    PT Start Time  1105    PT Stop Time  1145    PT Time Calculation (min)  40 min       Past Medical History:  Diagnosis Date  . Genital mutilation, female   . GERD (gastroesophageal reflux disease)   . SVD (spontaneous vaginal delivery) 05/28/2011    Past Surgical History:  Procedure Laterality Date  . genital mutilation    . TONSILLECTOMY  2004    There were no vitals filed for this visit.                    [facility] Adult PT Treatment/Exercise - 06/15/18 0001      Exercises   Exercises  Neck      Neck Exercises: Supine   Other Supine Exercise  supine horizontal abduction and ER with yellow band.       Ultrasound   Ultrasound Location  right neck and right upper trap    Ultrasound Parameters  1.5 w/cm2 100% x 8 min    Ultrasound Goals  Pain      Manual Therapy   Soft tissue mobilization  Seated right upper trap Trigger Point release and soft tissue work                PT Short Term Goals - 05/31/18 0951      PT SHORT TERM GOAL #1   Title  Pt will be independent with initial HEP    Baseline  no knowlledge    Time  3    Period  Weeks    Status  New    Target Date  06/21/18      PT SHORT TERM GOAL #2   Title  Pt will be able to sleep with 50% greater comfort on right side at night for sleep    Baseline  Pt unable to intially sleep , unable to sleepon right  side can only sleep up to 3 hours at night    Time  3    Period  Weeks    Status  New    Target Date  06/21/18      PT SHORT TERM GOAL #3   Title  Pt will increase right cervical side bend and rotation 10 degrees to increase comfort while doing household chores    Baseline  right side bend 35 and rotation 38 at eval    Time  3    Period  Weeks    Status  New    Target Date  06/21/18        PT Long Term Goals - 05/31/18 1137      PT LONG TERM GOAL #1   Title  To be reassessed at 4th visit and make LTG's            Plan -  06/15/18 1237    Clinical Impression Statement  Pt reports improvement of pain since last visit. She is doing HEP. Continued with theraband strengthening, [place] and manual. Some mild pain with therabands,     PT Next Visit Plan  Reveiw initial HEP and progress chin tuck, deep neck flexors, manual    PT Home Exercise Plan  neck stretchs, shoulder retraction and neck retraction, yellow band scap retract with ER     Consulted and Agree with Plan of Care  Patient       Patient will benefit from skilled therapeutic intervention in order to improve the following deficits and impairments:  Pain, Postural dysfunction, Improper body mechanics, Impaired UE functional use, Increased muscle spasms, Decreased strength, Decreased range of motion  Visit Diagnosis: Muscle weakness (generalized)  Abnormal posture  Cervicalgia     Problem List Patient Active Problem List   Diagnosis Date Noted  . Chronic constipation 01/19/2017  . Dysuria 01/19/2017  . Generalized abdominal pain 01/19/2017  . Pain in the chest 11/16/2015  . Gastroesophageal reflux disease without esophagitis 11/16/2015  . Other fatigue 11/16/2015    [name], PTA 06/15/2018, 12:39 PM  [facility] [address] [place], [place], [address] Phone: [phone]   Fax:  [phone]  Name: Kirsten Torres MRN: [id number] Date of  Birth: Sep 25, 1970

## 2018-06-19 ENCOUNTER — Ambulatory Visit: Payer: Medicaid Other | Admitting: Physical Therapy

## 2018-06-19 ENCOUNTER — Encounter: Payer: Self-pay | Admitting: Physical Therapy

## 2018-06-19 DIAGNOSIS — M6281 Muscle weakness (generalized): Secondary | ICD-10-CM

## 2018-06-19 DIAGNOSIS — R293 Abnormal posture: Secondary | ICD-10-CM

## 2018-06-19 DIAGNOSIS — M542 Cervicalgia: Secondary | ICD-10-CM | POA: Diagnosis not present

## 2018-06-19 NOTE — Therapy (Signed)
[place], [place], [address] Phone: [phone]   Fax:  [phone]  Physical Therapy Treatment/Reauthorization / Recertification  Patient Details  Name: Kirsten Torres MRN: [id number] Date of Birth: 04-Dec-1970 Referring Provider (PT): Lanae Boast FNP   Encounter Date: 06/19/2018  PT End of Session - 06/19/18 1108    Visit Number  4   initially 4 visits and asking for additional   Number of Visits  10   6 additional visits to maximize function   Date for PT Re-Evaluation  07/19/18    Authorization Type  MCD  first authorization period 4 visit    Authorization - Visit Number  4    Authorization - Number of Visits  12    PT Start Time  1105    PT Stop Time  1159    PT Time Calculation (min)  54 min    Activity Tolerance  Patient tolerated treatment well    Behavior During Therapy  [facility] for tasks assessed/performed       Past Medical History:  Diagnosis Date  . Genital mutilation, female   . GERD (gastroesophageal reflux disease)   . SVD (spontaneous vaginal delivery) 05/28/2011    Past Surgical History:  Procedure Laterality Date  . genital mutilation    . TONSILLECTOMY  2004    There were no vitals filed for this visit.  Subjective Assessment - 06/19/18 1134    Subjective  HEP intermittently with pain, 6/10 today     Limitations  House hold activities;Walking;Standing;Sitting    How long can you sit comfortably?  1 hour    How long can you stand comfortably?  1 hour    How long can you walk comfortably?  1 hour    Diagnostic tests  x ray in 2017 with narrowing     Patient Stated Goals  relieve pain, do household chores     Currently in Pain?  Yes    Pain Score  6     Pain Location  Neck    Pain Orientation  Right    Pain Descriptors / Indicators  Sore    Pain Radiating Towards  radiating to hand c 5 6    Pain Onset  More than a month ago    Pain Frequency  Intermittent    Aggravating Factors   standing to wash dishes and do laundry and work at [employer] PT Assessment - 06/19/18 0001      Assessment   Medical Diagnosis  neck pain in right    Referring Provider (PT)  Lanae Boast FNP    Onset Date/Surgical Date  01/28/18   worsened in last months but have had for 5 years   Hand Dominance  Left      Observation/Other Assessments   Focus on Therapeutic Outcomes (FOTO)   FOTO not taken      AROM   Right Shoulder Flexion  150 Degrees    Right Shoulder ABduction  120 Degrees   aggravates neck   Left Shoulder Flexion  160 Degrees    Left Shoulder ABduction  152 Degrees    Cervical Flexion  40   at end range   Cervical Extension  45   initial with movement   Cervical - Right Side Bend  45   pain with movement still but greater range   Cervical - Left Side Bend  54    Cervical -  Right Rotation  60    Cervical - Left Rotation  55   pain at end     Strength   Overall Strength Comments  supine head flexion pt immediately compensates with SCM muscle holds for 10 secs    Right Shoulder Flexion  4+/5   inhibited due to pain in right neck   Right Shoulder ABduction  4/5   inhibited due to pain inright neck   Left Shoulder Flexion  5/5    Left Shoulder ABduction  5/5      Palpation   Palpation comment  decreased muscle increased tissue extensibility                   [facility] Adult PT Treatment/Exercise - 06/19/18 1120      Self-Care   Self-Care  Other Self-Care Comments    Other Self-Care Comments   educated on TPDN and after care and precautians.  also emphasis on exercise. cervical stabilization/strength for longterm care and comfort      Exercises   Exercises  Neck      Neck Exercises: Supine   Cervical Isometrics  Right lateral flexion;Left lateral flexion;Right rotation;Left rotation;3 secs;5 reps    Other Supine Exercise  chin tuck x 10 with 5 sec hold,, practice of supine flexion hold    Other Supine Exercise   supine horizontal abduction and ER with yellow band.       Modalities   Modalities  Moist Heat      Moist Heat Therapy   Number Minutes Moist Heat  15 Minutes    Moist Heat Location  Cervical      Manual Therapy   Manual Therapy  Soft tissue mobilization;Myofascial release    Manual therapy comments  skilled palpation of TPDN    Joint Mobilization  C-3 to C-5  joint lateral UPA    Soft tissue mobilization  Seated right upper trap Trigger Point release and soft tissue work  and upper trap/ levator     Myofascial Release  right upper trap      Neck Exercises: Stretches   Upper Trapezius Stretch  3 reps;Right;30 seconds    Levator Stretch  Right;3 reps;30 seconds       Trigger Point Dry Needling - 06/19/18 1137    Consent Given?  Yes    Education Handout Provided  Yes    Muscles Treated Upper Body  Upper trapezius;Levator scapulae;Longissimus;Sternocleidomastoid   right side only   Sternocleidomastoid Response  Twitch response elicited;Palpable increased muscle length    Upper Trapezius Response  Twitch reponse elicited;Palpable increased muscle length    Levator Scapulae Response  Twitch response elicited;Palpable increased muscle length    Longissimus Response  Twitch response elicited;Palpable increased muscle length   C-3 and 4          PT Education - 06/19/18 1120    Education Details  education on TPDN precautians and aftercare.  added to HEP       PT Short Term Goals - 06/19/18 1127      PT SHORT TERM GOAL #1   Title  Pt will be independent with initial HEP    Baseline  Pt are independent    Time  3    Period  Weeks    Status  Achieved      PT SHORT TERM GOAL #2   Title  Pt will be able to sleep with 50% greater comfort on right side at night for sleep    Baseline  Pt is 70 % better with sleep on right side and sleeping at night as pt reports    Time  3    Period  Weeks    Status  Achieved      PT SHORT TERM GOAL #3   Title  Pt will increase right  cervical side bend and rotation 10 degrees to increase comfort while doing household chores    Baseline  right side bend 45 and rotation 55 at today     Time  3    Period  Weeks    Status  Achieved        PT Long Term Goals - 06/19/18 1131      PT LONG TERM GOAL #1   Title  Pt will be independent with advanced HEP for cervical stabilization     Baseline  Pt independent with basic/iniital HEP now but needs improvement in strength and stabilization    Time  4    Period  Weeks    Status  New    Target Date  07/19/18      PT LONG TERM GOAL #2   Title  Pt will be able to stand and perform vacuuming and sweeping without exacerbating pain greater than 3/10    Baseline  Pt is 6/10 now and increased pain with turning neck greater than 55 degrees    Time  4    Period  Weeks    Status  New    Target Date  07/19/18      PT LONG TERM GOAL #3   Title  Pt will be able to sit at computer for 2 hours in order to complete school and household work with proper posture and appropriate breaks and pain no greater than 3/10    Baseline  Pt unable to sit at computer more than 1 hour to work    Time  4    Period  Weeks    Status  New    Target Date  07/19/18      PT LONG TERM GOAL #4   Title  Pt will be able to perform supine neck flexion one inch from horizontal in order to demonstrate increased deep neck flexor strength    Baseline  Pt now able to hold 10 sec 1 inch from horizontal ( normal 35 sec)    Time  4    Period  Weeks    Status  New    Target Date  07/19/18      PT LONG TERM GOAL #5   Title  "Pt will not wake due to pain  while turning in bed while sleeping at night    Baseline  Pt is able to sleep on right with 70% better comfort but still wakes during night with movment    Time  4    Period  Weeks    Status  New    Target Date  07/19/18            Plan - 06/19/18 1110    Clinical Impression Statement  Pt reports she is able to perform her initial HEP. Pt has achieved all  STG's and would benefit from reinforcement of strengthening for maximizing LTG's  Pt supine flexion hold is now 10 sec from 0 sec. ( normal is 35 seconds without SCM activation/compensation) Ms. Wickens now has increased right side bend cervical of 45 and right  rotation 55. Pt still have significant pain and understands that she needs cervical stabilzation/strength  to control pain for household chores and  turning of head for sleep, riding in car and being able to work on [employer].  Pt would benefit from additional 6 visit for  reiniforce HEP for strengtheinning and cervical stabilization. of next 4 weeks.    Rehab Potential  Good    PT Frequency  2x / week    PT Duration  4 weeks    PT Treatment/Interventions  ADLs/Self Care Home Management;Cryotherapy;Electrical Stimulation;Iontophoresis 4mg /ml Dexamethasone;Moist Heat;Traction;Ultrasound;Therapeutic exercise;Therapeutic activities;Neuromuscular re-education;Patient/family education;Manual techniques;Passive range of motion;Dry needling;Taping;Joint Manipulations    PT Next Visit Plan  cervical stabilization strengthening.  possibel TPDN assess, emphasis on exercise after another TPDN or 2    PT Home Exercise Plan  neck stretchs, shoulder retraction and neck retraction, yellow band scap retract with ER     Consulted and Agree with Plan of Care  Patient       Patient will benefit from skilled therapeutic intervention in order to improve the following deficits and impairments:  Pain, Postural dysfunction, Improper body mechanics, Impaired UE functional use, Increased muscle spasms, Decreased strength, Decreased range of motion  Visit Diagnosis: Muscle weakness (generalized)  Abnormal posture  Cervicalgia     Problem List Patient Active Problem List   Diagnosis Date Noted  . Chronic constipation 01/19/2017  . Dysuria 01/19/2017  . Generalized abdominal pain 01/19/2017  . Pain in the chest 11/16/2015  . Gastroesophageal reflux disease  without esophagitis 11/16/2015  . Other fatigue 11/16/2015   Voncille Lo, PT Certified Exercise Expert for the Aging Adult  06/19/18 5:42 PM Phone: [phone] Fax: [facility] [address] [place], [place], [address] Phone: [phone]   Fax:  [phone]  Name: Kirsten Torres MRN: [id number] Date of Birth: September 01, 1971

## 2018-06-19 NOTE — Patient Instructions (Addendum)
Trigger Point Dry Needling  . What is Trigger Point Dry Needling (DN)? o DN is a physical therapy technique used to treat muscle pain and dysfunction. Specifically, DN helps deactivate muscle trigger points (muscle knots).  o A thin filiform needle is used to penetrate the skin and stimulate the underlying trigger point. The goal is for a local twitch response (LTR) to occur and for the trigger point to relax. No medication of any kind is injected during the procedure.   . What Does Trigger Point Dry Needling Feel Like?  o The procedure feels different for each individual patient. Some patients report that they do not actually feel the needle enter the skin and overall the process is not painful. Very mild bleeding may occur. However, many patients feel a deep cramping in the muscle in which the needle was inserted. This is the local twitch response.   Marland Kitchen How Will I feel after the treatment? o Soreness is normal, and the onset of soreness may not occur for a few hours. Typically this soreness does not last longer than two days.  o Bruising is uncommon, however; ice can be used to decrease any possible bruising.  o In rare cases feeling tired or nauseous after the treatment is normal. In addition, your symptoms may get worse before they get better, this period will typically not last longer than 24 hours.   . What Can I do After My Treatment? o Increase your hydration by drinking more water for the next 24 hours. o You may place ice or heat on the areas treated that have become sore, however, do not use heat on inflamed or bruised areas. Heat often brings more relief post needling. o You can continue your regular activities, but vigorous activity is not recommended initially after the treatment for 24 hours. o DN is best combined with other physical therapy such as strengthening, stretching, and other therapies.         Trigger Point Dry Needling (DN) DN           .      DN       ( ).           .         (LTR)    .          .            .                 .     .                .     .               .         .   .       Marland Kitchen             Marland Kitchen                    24 .                24 .                       .                           24 .   DN  alzunad nuqtat al'iibr aljafa ma hu Trigger Point Dry Needling (DN) DN hu taqniat eilaj tabieiun tustakhdam lieilaj alam aleudalat walkhalal alwazifi. ealaa wajh altahdid , yusaeid DN fi 'iilgha' tanshit niqat thfyz aleadalat (eqadat aleadlat). ytmu aistikhdam 'iibrat khitiat raqiqat liaikhtiraq aljulad watahfiz nuqtat alzanad al'asasiati. walhadaf min dhlk hu huduth aistijabat nashil almahaliya (LTR) walikay nuqtat alzunad lilaistirkha'. la yatim haqn 'ayi dawa' min 'ayi nawe khilal aleamaliat. Lynann Beaver yasheur  nuqtat alzinad al'iibrat aljafa? al'iijra' mukhtalif balnsbt likuli maridin. yufid bed almardaa 'anahum la yasheurun balfel 'ana al'iibrat tadkhul aljulad wa'ana aleamaliat laysat mulimatan bishakl eamin. qad yahduth nazif khafif lilghayati. wamae dhlk , yasheur alkthyr min almardaa bialtashanuj aleamiq fi aleadalat alty tama 'iidkhal al'iibrat fiha. hadha hu aistijabat nashil almahaliyat. kayf sa'ashear baed alelaj? wajae tabieiun , waqad la tahduth zuhur waje libade saeat. eadtan la yastamiru hdha aljaean limudat 'atwal min yawmayn. alkadamat ghyr shayieat. yumkin aistikhdam althalj litaqlil 'ayi kadamat muhtamalatin. fi halat nadirat , yakun alshueur bialtueab 'aw alghuthayan baed aleilaj amrana tbyeyana. bial'iidafat 'iilaa dhlk , qad tazdad al'aerad sw'ana qabl 'an tatahasan , walan tastaghriq hadhih alfatrat eadatan 'akthar min 24 saeat. madha yumkinuni 'an 'afeal baed ealaji? zid min tartibik bisharb almazid min alma' limudat 24 saeat. yumkinuk wade althalj 'aw alhararat ealaa almanatiq almuealajat alty 'asbahat mulimatan , wamae dhlk , la tastakhdim alhararat ealaa almanatiq almultahibat 'aw alkadamat. alhararat fi kthyr min al'ahyan yajlib almazid min al'iighathat alwakhz bialabir [place] muasalat 'anshitatik almuetadat , walakun la yusaa binashat nashit fi albidayat baed aleilaj limudat 24 saeat. ytm damj DN bishakl 'afdal mae aleilaj altabieii alakhar mthl altaqwiat waltamadud waleilajat al'ukhraa   Voncille Lo, PT Certified Exercise Expert for the Aging Adult  06/19/18 11:20 AM Phone: [phone] Fax: [phone]

## 2018-06-27 ENCOUNTER — Other Ambulatory Visit: Payer: Self-pay | Admitting: Family Medicine

## 2018-06-27 MED FILL — PROCTOZONE-HC 2.5 % CREA: 2.5 | 30 days supply | Qty: 30 | Fill #1

## 2018-06-27 MED FILL — OMEPRAZOLE 20 MG CAP: 20 | 30 days supply | Qty: 30 | Fill #0

## 2018-06-29 ENCOUNTER — Telehealth: Payer: Self-pay

## 2018-06-29 DIAGNOSIS — K5909 Other constipation: Secondary | ICD-10-CM

## 2018-07-02 MED ORDER — LINACLOTIDE 145 MCG PO CAPS
145.0000 ug | ORAL_CAPSULE | Freq: Every day | ORAL | 1 refills | Status: DC
Start: 2018-07-02 — End: 2018-11-01

## 2018-07-02 MED FILL — LINZESS 145 MCG CAPSULE: 145 | 30 days supply | Qty: 30 | Fill #0

## 2018-07-02 NOTE — Telephone Encounter (Signed)
Refill for linzess sent into pharmacy. Thanks!

## 2018-07-03 ENCOUNTER — Telehealth: Payer: Self-pay

## 2018-07-03 ENCOUNTER — Ambulatory Visit: Payer: Medicaid Other | Admitting: Physical Therapy

## 2018-07-03 ENCOUNTER — Encounter: Payer: Self-pay | Admitting: Physical Therapy

## 2018-07-03 DIAGNOSIS — M542 Cervicalgia: Secondary | ICD-10-CM

## 2018-07-03 DIAGNOSIS — M6281 Muscle weakness (generalized): Secondary | ICD-10-CM | POA: Diagnosis not present

## 2018-07-03 DIAGNOSIS — R293 Abnormal posture: Secondary | ICD-10-CM | POA: Diagnosis not present

## 2018-07-03 NOTE — Patient Instructions (Addendum)
Over Head Pull: Narrow Grip       On back, knees bent, feet flat, band across thighs, elbows straight but relaxed. Pull hands apart (start). Keeping elbows straight, bring arms up and over head, hands toward floor. Keep pull steady on band. Hold momentarily. Return slowly, keeping pull steady, back to start. Repeat __15_ times. Band color ___red__   Side Pull: Double Arm   On back, knees bent, feet flat. Arms perpendicular to body, shoulder level, elbows straight but relaxed. Pull arms out to sides, elbows straight. Resistance band comes across collarbones, hands toward floor. Hold momentarily. Slowly return to starting position. Repeat _15__ times. Band color _red___   Sash   On back, knees bent, feet flat, left hand on left hip, right hand above left. Pull right arm DIAGONALLY (hip to shoulder) across chest. Bring right arm along head toward floor. Hold momentarily. Slowly return to starting position. Repeat _15__ times. Do with left arm. Band color ___red___   Shoulder Rotation: Double Arm   On back, knees bent, feet flat, elbows tucked at sides, bent 90, hands palms up. Pull hands apart and down toward floor, keeping elbows near sides. Hold momentarily. Slowly return to starting position. Repeat _15__ times. Band color __red____   Voncille Lo, PT Certified Exercise Expert for the Aging Adult  07/03/18 11:15 AM Phone: [phone] Fax: [phone]

## 2018-07-03 NOTE — Therapy (Signed)
Kirsten Torres, [place], [address] Phone: [phone]   Fax:  [phone]  Physical Therapy Treatment  Patient Details  Name: Kirsten Torres MRN: [id number] Date of Birth: 12-02-1970 Referring Provider (PT): Lanae Boast FNP   Encounter Date: 07/03/2018  PT End of Session - 07/03/18 1108    Visit Number  5    Number of Visits  10    Date for PT Re-Evaluation  07/19/18    Authorization Type  MCD  first authorization period 4 visit    Authorization - Visit Number  5    Authorization - Number of Visits  12    PT Start Time  7425    Activity Tolerance  Patient tolerated treatment well    Behavior During Therapy  [facility] for tasks assessed/performed       Past Medical History:  Diagnosis Date  . Genital mutilation, female   . GERD (gastroesophageal reflux disease)   . SVD (spontaneous vaginal delivery) 05/28/2011    Past Surgical History:  Procedure Laterality Date  . genital mutilation    . TONSILLECTOMY  2004    There were no vitals filed for this visit.  Subjective Assessment - 07/03/18 1110    Subjective  I like the TPDN for the back of my neck.  Today I have from neck pain ( by my clavicle) I am starting to feel bettter doing my household chores    Patient is accompained by:  Interpreter    Limitations  House hold activities;Walking;Standing;Sitting    Diagnostic tests  x ray in 2017 with narrowing     Patient Stated Goals  relieve pain, do household chores     Currently in Pain?  Yes    Pain Score  7     Pain Location  Neck    Pain Orientation  Right    Pain Descriptors / Indicators  Sore    Pain Type  Chronic pain    Pain Onset  More than a month ago    Pain Frequency  Intermittent                       [facility] Adult PT Treatment/Exercise - 07/03/18 1120      Exercises   Exercises  Neck      Neck Exercises: Seated   Other Seated Exercise  Pt sitting with bil row and red  t band 10 x 2 with VC for keeping neck stable and still during movement      Neck Exercises: Supine   Cervical Isometrics  Right lateral flexion;Left lateral flexion;Right rotation;Left rotation;3 secs;5 reps    Other Supine Exercise  chin tuck x 10 with 5 sec hold,, practice of supine flexion hold    Other Supine Exercise  supine scapular stabilzers with red t band bil flex, horizontal abd, diagonals and Bil ER      Modalities   Modalities  Moist Heat      Moist Heat Therapy   Number Minutes Moist Heat  15 Minutes    Moist Heat Location  Cervical      Manual Therapy   Manual Therapy  Soft tissue mobilization;Myofascial release    Manual therapy comments  skilled palpation of TPDN    Joint Mobilization  C-3 to C-5  joint lateral UPA    Soft tissue mobilization  right scalenes and right upper trap and levator and cervical paraspinals, over stretch for lateral side bend and lateral  rotation bil     Myofascial Release  right upper trap and right scalenes      Neck Exercises: Stretches   Upper Trapezius Stretch  Right;1 rep;30 seconds;Left    Levator Stretch  Right;Left;1 rep;30 seconds    Neck Stretch  2 reps;10 seconds    Neck Stretch Limitations  attempted scalene stretch at end of session       Trigger Point Dry Needling - 07/03/18 1126    Consent Given?  Yes    Education Handout Provided  No   previously given   Muscles Treated Upper Body  Upper trapezius;Scalenes;Levator scapulae;Longissimus   right side only   Sternocleidomastoid Response  Twitch response elicited;Palpable increased muscle length    Scalenes Response  Twitch reponse elicited;Palpable increased muscle length    Upper Trapezius Response  Twitch reponse elicited;Palpable increased muscle length   left and right   Levator Scapulae Response  Twitch response elicited;Palpable increased muscle length   left and right   Longissimus Response  Twitch response elicited;Palpable increased muscle length   C-3 c-4           PT Education - 07/03/18 1118    Education Details  added to HEP for supine scapular stabilizers    Person(s) Educated  Patient    Methods  Explanation;Demonstration;Tactile cues;Verbal cues;Handout    Comprehension  Verbalized understanding;Returned demonstration       PT Short Term Goals - 06/19/18 1127      PT SHORT TERM GOAL #1   Title  Pt will be independent with initial HEP    Baseline  Pt are independent    Time  3    Period  Weeks    Status  Achieved      PT SHORT TERM GOAL #2   Title  Pt will be able to sleep with 50% greater comfort on right side at night for sleep    Baseline  Pt is 70 % better with sleep on right side and sleeping at night as pt reports    Time  3    Period  Weeks    Status  Achieved      PT SHORT TERM GOAL #3   Title  Pt will increase right cervical side bend and rotation 10 degrees to increase comfort while doing household chores    Baseline  right side bend 45 and rotation 55 at today     Time  3    Period  Weeks    Status  Achieved        PT Long Term Goals - 07/03/18 1246      PT LONG TERM GOAL #1   Title  Pt will be independent with advanced HEP for cervical stabilization     Baseline  Pt working on basic HEP for neck and needs to do more stabilizaton    Time  4    Period  Weeks    Status  On-going      PT LONG TERM GOAL #2   Title  Pt will be able to stand and perform vacuuming and sweeping without exacerbating pain greater than 3/10    Baseline  Pt is 7/10 today    Time  4    Period  Weeks    Status  On-going      PT LONG TERM GOAL #3   Title  Pt will be able to sit at computer for 2 hours in order to complete school and household work with proper posture  and appropriate breaks and pain no greater than 3/10    Baseline  Pt able to sit at computer longer but does not state how long    Time  4    Period  Weeks    Status  On-going      PT LONG TERM GOAL #4   Title  Pt will be able to perform supine neck flexion  one inch from horizontal in order to demonstrate increased deep neck flexor strength    Baseline  Pt now able to hold 10 sec 1 inch from horizontal ( normal 35 sec) from eval    Time  4    Period  Weeks    Status  On-going      PT LONG TERM GOAL #5   Title  "Pt will not wake due to pain  while turning in bed while sleeping at night    Baseline  Pt able to sleep better at night but has new pain on right supraclavicle today.     Time  4    Period  Weeks    Status  On-going            Plan - 07/03/18 1145    Clinical Impression Statement  Pt returns to clinic complaining of 7/10 pain and new supraclavicular pain which resolved after right scalene TPDN.  Pt consented to TPDN and was closely monitored throughout session.  Pt was able to move neck more fluidly post TPDN and manual and exercises.  Pt will continue with emphasis on neck stabilization in following visits.   Pt indicated through interpreter that she had decreased pain after RX today    Rehab Potential  Good    PT Frequency  2x / week    PT Duration  4 weeks    PT Treatment/Interventions  ADLs/Self Care Home Management;Cryotherapy;Electrical Stimulation;Iontophoresis 4mg /ml Dexamethasone;Moist Heat;Traction;Ultrasound;Therapeutic exercise;Therapeutic activities;Neuromuscular re-education;Patient/family education;Manual techniques;Passive range of motion;Dry needling;Taping;Joint Manipulations    PT Next Visit Plan  cervical stabilization strengthening. scalene stretch.  deep neck flexion/  emphasis on exercise after 3rd TPDN,    PT Home Exercise Plan  neck stretchs, shoulder retraction and neck retraction, yellow band scap retract with ER     Consulted and Agree with Plan of Care  Patient       Patient will benefit from skilled therapeutic intervention in order to improve the following deficits and impairments:  Pain, Postural dysfunction, Improper body mechanics, Impaired UE functional use, Increased muscle spasms, Decreased  strength, Decreased range of motion  Visit Diagnosis: Muscle weakness (generalized)  Abnormal posture  Cervicalgia     Problem List Patient Active Problem List   Diagnosis Date Noted  . Chronic constipation 01/19/2017  . Dysuria 01/19/2017  . Generalized abdominal pain 01/19/2017  . Pain in the chest 11/16/2015  . Gastroesophageal reflux disease without esophagitis 11/16/2015  . Other fatigue 11/16/2015   Voncille Lo, PT Certified Exercise Expert for the Aging Adult  07/03/18 12:56 PM Phone: [phone] Fax: [facility] [address] [place], [place], [address] Phone: [phone]   Fax:  [phone]  Name: Kirsten Torres MRN: [id number] Date of Birth: [birth year]

## 2018-07-04 NOTE — Telephone Encounter (Signed)
Called, no answer. Left a message for patient to call back and discuss questions about refills. Thanks!

## 2018-07-05 ENCOUNTER — Ambulatory Visit: Payer: Medicaid Other | Admitting: Physical Therapy

## 2018-07-05 ENCOUNTER — Encounter: Payer: Self-pay | Admitting: Physical Therapy

## 2018-07-05 DIAGNOSIS — R293 Abnormal posture: Secondary | ICD-10-CM

## 2018-07-05 DIAGNOSIS — M6281 Muscle weakness (generalized): Secondary | ICD-10-CM | POA: Diagnosis not present

## 2018-07-05 DIAGNOSIS — M542 Cervicalgia: Secondary | ICD-10-CM

## 2018-07-05 NOTE — Therapy (Signed)
[place], [place], [address] Phone: [phone]   Fax:  [phone]  Physical Therapy Treatment  Patient Details  Name: Kirsten Torres MRN: [id number] Date of Birth: 31-Jul-1971 Referring Provider (PT): Lanae Boast FNP   Encounter Date: 07/05/2018  PT End of Session - 07/05/18 0934    Visit Number  6    Number of Visits  10    Date for PT Re-Evaluation  07/19/18    Authorization Type  MCD  first authorization period 4 visit    Authorization - Visit Number  6    Authorization - Number of Visits  12    PT Start Time  0931    PT Stop Time  1009    PT Time Calculation (min)  38 min       Past Medical History:  Diagnosis Date  . Genital mutilation, female   . GERD (gastroesophageal reflux disease)   . SVD (spontaneous vaginal delivery) 05/28/2011    Past Surgical History:  Procedure Laterality Date  . genital mutilation    . TONSILLECTOMY  2004    There were no vitals filed for this visit.  Subjective Assessment - 07/05/18 0933    Subjective  A little pain. Mostly right side but a little on the left.     Currently in Pain?  Yes    Pain Score  7     Pain Location  Neck    Pain Orientation  Right;Left    Pain Descriptors / Indicators  Tender                       [facility] Adult PT Treatment/Exercise - 07/05/18 0001      Exercises   Exercises  Neck      Neck Exercises: Seated   Other Seated Exercise  Pt sitting with bil row and red t band 10 x 2 with VC for keeping neck stable and still during movement      Neck Exercises: Supine   Neck Retraction Limitations  chin tuck with arm circles, and alt UE flex/ext -added basic dead bug with Tra contract for core strength    Other Supine Exercise  chin tuck x 10 with 5 sec hold,, practice of supine flexion hold 10 sec x 3     Other Supine Exercise  supine scapular stabilzers with red t band bil flex, horizontal abd, diagonals and  Bil ER      Neck Exercises: Stretches   Upper Trapezius Stretch  Right;Left;2 reps;20 seconds    Levator Stretch  Right;Left;1 rep;2 reps;20 seconds    Neck Stretch  --    Corner Stretch  2 reps;30 seconds    Other Neck Stretches  scalens stretch 3 x 10 sec each side                PT Short Term Goals - 06/19/18 1127      PT SHORT TERM GOAL #1   Title  Pt will be independent with initial HEP    Baseline  Pt are independent    Time  3    Period  Weeks    Status  Achieved      PT SHORT TERM GOAL #2   Title  Pt will be able to sleep with 50% greater comfort on right side at night for sleep    Baseline  Pt is 70 % better with sleep on right side and sleeping at night  as pt reports    Time  3    Period  Weeks    Status  Achieved      PT SHORT TERM GOAL #3   Title  Pt will increase right cervical side bend and rotation 10 degrees to increase comfort while doing household chores    Baseline  right side bend 45 and rotation 55 at today     Time  3    Period  Weeks    Status  Achieved        PT Long Term Goals - 07/03/18 1246      PT LONG TERM GOAL #1   Title  Pt will be independent with advanced HEP for cervical stabilization     Baseline  Pt working on basic HEP for neck and needs to do more stabilizaton    Time  4    Period  Weeks    Status  On-going      PT LONG TERM GOAL #2   Title  Pt will be able to stand and perform vacuuming and sweeping without exacerbating pain greater than 3/10    Baseline  Pt is 7/10 today    Time  4    Period  Weeks    Status  On-going      PT LONG TERM GOAL #3   Title  Pt will be able to sit at computer for 2 hours in order to complete school and household work with proper posture and appropriate breaks and pain no greater than 3/10    Baseline  Pt able to sit at computer longer but does not state how long    Time  4    Period  Weeks    Status  On-going      PT LONG TERM GOAL #4   Title  Pt will be able to perform supine neck  flexion one inch from horizontal in order to demonstrate increased deep neck flexor strength    Baseline  Pt now able to hold 10 sec 1 inch from horizontal ( normal 35 sec) from eval    Time  4    Period  Weeks    Status  On-going      PT LONG TERM GOAL #5   Title  "Pt will not wake due to pain  while turning in bed while sleeping at night    Baseline  Pt able to sleep better at night but has new pain on right supraclavicle today.     Time  4    Period  Weeks    Status  On-going            Plan - 07/05/18 0935    Clinical Impression Statement  Pt reports pain at 7/10. She also states pain is much better and the neck pain she had is no longer present. Her subjective information is via interpreter but language barrier is still present as even after reviewing pain scale and the fact that she says she has no pain she still rates her pain at 7/10. She reports improved standing tolerace for house hold chores. Focused neck and scap stab this visit per PT poc.     PT Next Visit Plan  cervical stabilization strengthening. scalene stretch.  deep neck flexion/  emphasis on exercise after 3rd TPDN,    PT Home Exercise Plan  neck stretchs, shoulder retraction and neck retraction, yellow band scap retract with ER     Consulted and Agree with Plan of  Care  Patient       Patient will benefit from skilled therapeutic intervention in order to improve the following deficits and impairments:  Pain, Postural dysfunction, Improper body mechanics, Impaired UE functional use, Increased muscle spasms, Decreased strength, Decreased range of motion  Visit Diagnosis: Muscle weakness (generalized)  Abnormal posture  Cervicalgia     Problem List Patient Active Problem List   Diagnosis Date Noted  . Chronic constipation 01/19/2017  . Dysuria 01/19/2017  . Generalized abdominal pain 01/19/2017  . Pain in the chest 11/16/2015  . Gastroesophageal reflux disease without esophagitis 11/16/2015  . Other  fatigue 11/16/2015    [name] 07/05/2018, 10:07 AM  [facility] [address] [place], [place], [address] Phone: [phone]   Fax:  [phone]  Name: Kirsten Torres MRN: [id number] Date of Birth: Oct 11, 1970

## 2018-07-10 ENCOUNTER — Ambulatory Visit: Payer: Medicaid Other | Admitting: Physical Therapy

## 2018-07-10 ENCOUNTER — Encounter: Payer: Self-pay | Admitting: Physical Therapy

## 2018-07-10 DIAGNOSIS — M6281 Muscle weakness (generalized): Secondary | ICD-10-CM

## 2018-07-10 DIAGNOSIS — R293 Abnormal posture: Secondary | ICD-10-CM | POA: Diagnosis not present

## 2018-07-10 DIAGNOSIS — M542 Cervicalgia: Secondary | ICD-10-CM | POA: Diagnosis not present

## 2018-07-10 NOTE — Therapy (Signed)
[place], [place], [address] Phone: [phone]   Fax:  [phone]  Physical Therapy Treatment  Patient Details  Name: Kirsten Torres MRN: [id number] Date of Birth: 1971-09-02 Referring Provider (PT): Lanae Boast FNP   Encounter Date: 07/10/2018  PT End of Session - 07/10/18 1253    Visit Number  7    Number of Visits  10    Date for PT Re-Evaluation  07/19/18    Authorization Type  MCD  first authorization period 4 visit    Authorization - Visit Number  7    Authorization - Number of Visits  12    PT Start Time  0932    PT Stop Time  1025    PT Time Calculation (min)  53 min    Activity Tolerance  Patient tolerated treatment well    Behavior During Therapy  [facility] for tasks assessed/performed       Past Medical History:  Diagnosis Date  . Genital mutilation, female   . GERD (gastroesophageal reflux disease)   . SVD (spontaneous vaginal delivery) 05/28/2011    Past Surgical History:  Procedure Laterality Date  . genital mutilation    . TONSILLECTOMY  2004    There were no vitals filed for this visit.  Subjective Assessment - 07/10/18 0939    Subjective  A little pain on front of my shoulder today. 6/10      Patient is accompained by:  Interpreter    Limitations  House hold activities;Walking;Standing;Sitting    Diagnostic tests  x ray in 2017 with narrowing     Patient Stated Goals  relieve pain, do household chores     Currently in Pain?  Yes    Pain Score  6     Pain Location  Neck    Pain Orientation  Right;Left    Pain Descriptors / Indicators  Tender;Tightness    Pain Type  Chronic pain    Pain Onset  More than a month ago    Pain Frequency  Intermittent         [facility] PT Assessment - 07/10/18 1005      AROM   Right Shoulder Flexion  158 Degrees    Right Shoulder ABduction  149 Degrees   aggravates neck   Left Shoulder Flexion  160 Degrees    Left Shoulder ABduction   152 Degrees    Cervical Flexion  65    Cervical Extension  50    Cervical - Right Side Bend  60    Cervical - Left Side Bend  58    Cervical - Right Rotation  62    Cervical - Left Rotation  60      Strength   Overall Strength Comments  supine head flexion pt immediately compensates with SCM muscle holds for 15 secs    Right Shoulder Flexion  5/5    Right Shoulder ABduction  4+/5   inhibited due to pain inright neck   Left Shoulder Flexion  5/5    Left Shoulder ABduction  5/5                   [facility] Adult PT Treatment/Exercise - 07/10/18 0001      Exercises   Exercises  Neck      Shoulder Exercises: Standing   External Rotation  15 reps;Both;Strengthening;Theraband   x 2   Theraband Level (Shoulder External Rotation)  Level 2 (Red)    Extension  Strengthening;Both;15 reps;Theraband    Theraband Level (Shoulder Extension)  Level 2 (Red)   x2   Row  Strengthening;Both;15 reps;Theraband   x2   Theraband Level (Shoulder Row)  Level 2 (Red)      Moist Heat Therapy   Number Minutes Moist Heat  15 Minutes    Moist Heat Location  Cervical      Manual Therapy   Manual Therapy  Soft tissue mobilization;Myofascial release    Manual therapy comments  skilled palpation of TPDN    Joint Mobilization  C-3 to C-5  joint lateral UPA    Soft tissue mobilization  right upper traps, pectorals     Myofascial Release  right upper trap and right scalenes      Neck Exercises: Stretches   Corner Stretch  2 reps;30 seconds    Other Neck Stretches  scalens stretch 3 x 10 sec each side        Trigger Point Dry Needling - 07/10/18 0959    Consent Given?  Yes    Education Handout Provided  No   previously given   Muscles Treated Upper Body  Pectoralis major;Pectoralis minor   right side only   Upper Trapezius Response  Twitch reponse elicited;Palpable increased muscle length    Pectoralis Major Response  Twitch response elicited;Palpable increased muscle length    Pectoralis  Minor Response  Twitch response elicited;Palpable increased muscle length    Levator Scapulae Response  Twitch response elicited;Palpable increased muscle length           PT Education - 07/10/18 0950    Education Details  added standing shoulder to HEP with supine with VC for posture    Person(s) Educated  Patient;Other (comment)   interpreter   Methods  Explanation;Demonstration;Tactile cues;Verbal cues;Handout    Comprehension  Verbalized understanding;Returned demonstration       PT Short Term Goals - 06/19/18 1127      PT SHORT TERM GOAL #1   Title  Pt will be independent with initial HEP    Baseline  Pt are independent    Time  3    Period  Weeks    Status  Achieved      PT SHORT TERM GOAL #2   Title  Pt will be able to sleep with 50% greater comfort on right side at night for sleep    Baseline  Pt is 70 % better with sleep on right side and sleeping at night as pt reports    Time  3    Period  Weeks    Status  Achieved      PT SHORT TERM GOAL #3   Title  Pt will increase right cervical side bend and rotation 10 degrees to increase comfort while doing household chores    Baseline  right side bend 45 and rotation 55 at today     Time  3    Period  Weeks    Status  Achieved        PT Long Term Goals - 07/10/18 0951      PT LONG TERM GOAL #1   Title  Pt will be independent with advanced HEP for cervical stabilization     Baseline  Pt given standing scapular stabilizers with chin tuck today    Time  4    Period  Weeks    Status  On-going      PT LONG TERM GOAL #2   Title  Pt will be able to stand  and perform vacuuming and sweeping without exacerbating pain greater than 3/10    Baseline  I do all my chores except for vacuuming now    Time  4    Period  Weeks    Status  On-going      PT LONG TERM GOAL #3   Title  Pt will be able to sit at computer for 2 hours in order to complete school and household work with proper posture and appropriate breaks and pain  no greater than 3/10    Baseline  Pt able to sit at computer for 2 hours now at most 6/10 max reminded to move every hour    Time  4    Period  Weeks    Status  Partially Met      PT LONG TERM GOAL #4   Title  Pt will be able to perform supine neck flexion one inch from horizontal in order to demonstrate increased deep neck flexor strength    Baseline  Pt now able to hold 15 sec 1 inch from horizontal ( normal 35 sec) from eval    Time  4    Period  Weeks    Status  On-going      PT LONG TERM GOAL #5   Title  "Pt will not wake due to pain  while turning in bed while sleeping at night    Baseline  Pt able to sleep through the night, sitll has pain when sleeping on right shoulder     Time  4    Period  Weeks    Status  Achieved            Plan - 07/10/18 1254    Clinical Impression Statement  Pt reports 6/10 pain but pt has improved with cervical rotation.  Pt has complaint of right shoulder pain on pectoral and upper trap tightness. Pt reports being able to sit for 2 hours at computer but has 6/10 pain at the end. Pt educated on trying to take breaks at leastt every hour.   Pt able to hold supine flexion for 15 seconds and was told to work on chin tuck and deep neck felxor strength. Pt was progressed in exercise and told she would benefit from moving to all exercises and movement for maximum benefit    Rehab Potential  Good    PT Frequency  2x / week    PT Duration  4 weeks    PT Treatment/Interventions  ADLs/Self Care Home Management;Cryotherapy;Electrical Stimulation;Iontophoresis 16m/ml Dexamethasone;Moist Heat;Traction;Ultrasound;Therapeutic exercise;Therapeutic activities;Neuromuscular re-education;Patient/family education;Manual techniques;Passive range of motion;Dry needling;Taping;Joint Manipulations    PT Next Visit Plan  cervical stabilization strengthening. scalene stretch.  deep neck flexion/  emphasis on exercise after 3rd TPDN,    PT Home Exercise Plan  neck stretchs,  shoulder retraction and neck retraction, yellow band scap retract with ER standing scapular stabilizers, pec sttrech       Patient will benefit from skilled therapeutic intervention in order to improve the following deficits and impairments:  Pain, Postural dysfunction, Improper body mechanics, Impaired UE functional use, Increased muscle spasms, Decreased strength, Decreased range of motion  Visit Diagnosis: Muscle weakness (generalized)  Abnormal posture  Cervicalgia     Problem List Patient Active Problem List   Diagnosis Date Noted  . Chronic constipation 01/19/2017  . Dysuria 01/19/2017  . Generalized abdominal pain 01/19/2017  . Pain in the chest 11/16/2015  . Gastroesophageal reflux disease without esophagitis 11/16/2015  . Other fatigue 11/16/2015  Voncille Lo, PT Certified Exercise Expert for the Aging Adult  07/10/18 1:04 PM Phone: [phone] Fax: [facility] [address] [place], [place], [address] Phone: [phone]   Fax:  [phone]  Name: Kirsten Torres MRN: [id number] Date of Birth: [birth year]

## 2018-07-10 NOTE — Patient Instructions (Addendum)
         Kirsten Torres, PT Certified Exercise Expert for the Aging Adult  07/10/18 9:50 AM Phone: [phone] Fax: [phone]

## 2018-07-12 ENCOUNTER — Ambulatory Visit: Payer: Medicaid Other | Admitting: Physical Therapy

## 2018-07-12 ENCOUNTER — Encounter: Payer: Self-pay | Admitting: Physical Therapy

## 2018-07-12 DIAGNOSIS — R293 Abnormal posture: Secondary | ICD-10-CM | POA: Diagnosis not present

## 2018-07-12 DIAGNOSIS — M542 Cervicalgia: Secondary | ICD-10-CM | POA: Diagnosis not present

## 2018-07-12 DIAGNOSIS — M6281 Muscle weakness (generalized): Secondary | ICD-10-CM

## 2018-07-12 NOTE — Therapy (Signed)
[place], [place], [address] Phone: [phone]   Fax:  [phone]  Physical Therapy Treatment/Recert extension  Patient Details  Name: Kirsten Torres MRN: [id number] Date of Birth: 03/13/1971 Referring Provider (PT): Lanae Boast FNP   Encounter Date: 07/12/2018  PT End of Session - 07/12/18 0941    Visit Number  8    Number of Visits  12    Date for PT Re-Evaluation  07/30/18    Authorization Type  MCD  first authorization period  reauthorization of 12 visits total end date 07-30-18    Authorization - Visit Number  8    Authorization - Number of Visits  12       Past Medical History:  Diagnosis Date  . Genital mutilation, female   . GERD (gastroesophageal reflux disease)   . SVD (spontaneous vaginal delivery) 05/28/2011    Past Surgical History:  Procedure Laterality Date  . genital mutilation    . TONSILLECTOMY  2004    There were no vitals filed for this visit.  Subjective Assessment - 07/12/18 0942    Subjective  I am better from the other day 5/10      Limitations  House hold activities;Walking;Standing;Sitting    How long can you sit comfortably?  can sit for 2 hours with pain goes up to 6-7/10     How long can you stand comfortably?  1 hour tried to  vacuum 5-6/10    How long can you walk comfortably?  1 hour    Patient Stated Goals  relieve pain, do household chores     Currently in Pain?  Yes    Pain Score  5     Pain Location  Neck         [facility] PT Assessment - 07/12/18 0001      Assessment   Medical Diagnosis  neck pain in right    Referring Provider (PT)  Lanae Boast FNP      Observation/Other Assessments   Focus on Therapeutic Outcomes (FOTO)   FOTO not taken      AROM   Right Shoulder Flexion  158 Degrees    Right Shoulder ABduction  149 Degrees   aggravates neck   Left Shoulder Flexion  160 Degrees    Left Shoulder ABduction  152 Degrees    Cervical  Flexion  65    Cervical Extension  50    Cervical - Right Side Bend  60    Cervical - Left Side Bend  58    Cervical - Right Rotation  62    Cervical - Left Rotation  60      Strength   Overall Strength Comments  supine head flexion pt immediately compensates with SCM muscle holds for 15 secs    Right Shoulder Flexion  5/5    Right Shoulder ABduction  4+/5   inhibited due to pain inright neck   Left Shoulder Flexion  5/5    Left Shoulder ABduction  5/5                   [facility] Adult PT Treatment/Exercise - 07/12/18 1000      Exercises   Exercises  Neck      Neck Exercises: Seated   Other Seated Exercise  Pt sitting with bil row and red t band 10 x 2 with VC for keeping neck stable and still during movement      Neck Exercises: Supine  Neck Retraction Limitations  chin tuck with arm circles, and alt UE flex/ext -added basic dead bug with Tra contract for core strength    Other Supine Exercise  supine scapular stabilzers with red t band bil flex, horizontal abd, diagonals and Bil ER      Shoulder Exercises: Standing   External Rotation  15 reps;Both;Strengthening;Theraband   x 2   Theraband Level (Shoulder External Rotation)  Level 2 (Red)    Extension  Strengthening;Both;15 reps;Theraband    Theraband Level (Shoulder Extension)  Level 2 (Red)   x2   Row  Strengthening;Both;15 reps;Theraband   x2   Theraband Level (Shoulder Row)  Level 2 (Red)    Other Standing Exercises  serratus punch with green t band  x 20 , green t band on wall with       Modalities   Modalities  Moist Heat      Moist Heat Therapy   Number Minutes Moist Heat  15 Minutes    Moist Heat Location  Cervical;Shoulder   right side     Manual Therapy   Manual Therapy  Soft tissue mobilization;Myofascial release    Manual therapy comments  skilled palpation of TPDN    Soft tissue mobilization  right upper traps, pectorals right SCM and  scalene      Neck Exercises: Stretches   Upper  Trapezius Stretch  Right;Left;2 reps;20 seconds    Levator Stretch  Right;Left;1 rep;2 reps;20 seconds    Corner Stretch  2 reps;30 seconds       Trigger Point Dry Needling - 07/12/18 0948    Consent Given?  Yes    Education Handout Provided  No   previously given   Muscles Treated Upper Body  Pectoralis minor;Pectoralis major;Subscapularis;Levator scapulae;Upper trapezius   right side only   Sternocleidomastoid Response  Twitch response elicited;Palpable increased muscle length    Scalenes Response  Twitch reponse elicited;Palpable increased muscle length    Upper Trapezius Response  Twitch reponse elicited;Palpable increased muscle length    Pectoralis Major Response  Twitch response elicited;Palpable increased muscle length    Pectoralis Minor Response  Palpable increased muscle length    Levator Scapulae Response  Twitch response elicited;Palpable increased muscle length    Subscapularis Response  Twitch response elicited;Palpable increased muscle length   marked response and twitvch            PT Short Term Goals - 06/19/18 1127      PT SHORT TERM GOAL #1   Title  Pt will be independent with initial HEP    Baseline  Pt are independent    Time  3    Period  Weeks    Status  Achieved      PT SHORT TERM GOAL #2   Title  Pt will be able to sleep with 50% greater comfort on right side at night for sleep    Baseline  Pt is 70 % better with sleep on right side and sleeping at night as pt reports    Time  3    Period  Weeks    Status  Achieved      PT SHORT TERM GOAL #3   Title  Pt will increase right cervical side bend and rotation 10 degrees to increase comfort while doing household chores    Baseline  right side bend 45 and rotation 55 at today     Time  3    Period  Weeks    Status  Achieved  PT Long Term Goals - 07/12/18 0944      PT LONG TERM GOAL #1   Title  Pt will be independent with advanced HEP for cervical stabilization     Baseline  Pt  progressing to advanced HEP , needs reinforcement    Time  4    Period  Weeks    Status  On-going    Target Date  07/30/18      PT LONG TERM GOAL #2   Title  Pt will be able to stand and perform vacuuming and sweeping without exacerbating pain greater than 3/10    Baseline  Pt now vacuuming but with pain 5-6/10     Time  4    Period  Weeks    Status  On-going    Target Date  07/30/18      PT LONG TERM GOAL #3   Title  Pt will be able to sit at computer for 2 hours in order to complete school and household work with proper posture and appropriate breaks and pain no greater than 3/10    Baseline  Pt able to sit at computer for 2 hours now at most 6/10 max reminded to move every hour    Time  4    Period  Weeks    Status  Partially Met    Target Date  07/30/18      PT LONG TERM GOAL #4   Title  Pt will be able to perform supine neck flexion one inch from horizontal in order to demonstrate increased deep neck flexor strength    Baseline  Pt now able to hold 15 sec 1 inch from horizontal ( normal 35 sec) from from 07-10-18    Time  4    Period  Weeks    Status  On-going    Target Date  07/30/18      PT LONG TERM GOAL #5   Title  "Pt will not wake due to pain  while turning in bed while sleeping at night    Baseline  Pt able to sleep through the night, sitll has pain when sleeping on right shoulder     Time  4    Period  Weeks    Status  Achieved    Target Date  07/30/18            Plan - 07/12/18 1328    Clinical Impression Statement  Pt making good progress toward goals but needs to extend visits until 07-30-18 to reinforce HEP and strengtheining for sub scapular stabiliers and neck flexion.  Pt needs last few visits to reinforce active exericse and reduce passive treatment.  Pt is able to sit at computer for 2 hours and able to sleep at night but pain at 5/10 intermittently. Pt will continue until 07-30-18 to complete allowed Medicaid visits     Rehab Potential  Good     PT Frequency  2x / week    PT Duration  4 weeks    PT Treatment/Interventions  ADLs/Self Care Home Management;Cryotherapy;Electrical Stimulation;Iontophoresis 57m/ml Dexamethasone;Moist Heat;Traction;Ultrasound;Therapeutic exercise;Therapeutic activities;Neuromuscular re-education;Patient/family education;Manual techniques;Passive range of motion;Dry needling;Taping;Joint Manipulations    PT Next Visit Plan  cervical stabilization strengthening. scalene stretch.  deep neck flexion/  emphasis on exercise after 3rd TPDN,    PT Home Exercise Plan  neck stretchs, shoulder retraction and neck retraction, yellow band scap retract with ER standing scapular stabilizers, pec sttrech    Consulted and Agree with Plan of Care  Patient  Patient will benefit from skilled therapeutic intervention in order to improve the following deficits and impairments:  Pain, Postural dysfunction, Improper body mechanics, Impaired UE functional use, Increased muscle spasms, Decreased strength, Decreased range of motion  Visit Diagnosis: Muscle weakness (generalized)  Abnormal posture  Cervicalgia     Problem List Patient Active Problem List   Diagnosis Date Noted  . Chronic constipation 01/19/2017  . Dysuria 01/19/2017  . Generalized abdominal pain 01/19/2017  . Pain in the chest 11/16/2015  . Gastroesophageal reflux disease without esophagitis 11/16/2015  . Other fatigue 11/16/2015    Voncille Lo, PT Certified Exercise Expert for the Aging Adult  07/12/18 1:32 PM Phone: [phone] Fax: [facility] [address] [place], [place], [address] Phone: [phone]   Fax:  [phone]  Name: Kirsten Torres MRN: [id number] Date of Birth: 1971/03/19

## 2018-07-16 ENCOUNTER — Encounter: Payer: Self-pay | Admitting: Physical Therapy

## 2018-07-16 ENCOUNTER — Ambulatory Visit: Payer: Medicaid Other | Attending: Family Medicine | Admitting: Physical Therapy

## 2018-07-16 DIAGNOSIS — M6281 Muscle weakness (generalized): Secondary | ICD-10-CM

## 2018-07-16 DIAGNOSIS — R293 Abnormal posture: Secondary | ICD-10-CM | POA: Insufficient documentation

## 2018-07-16 DIAGNOSIS — M542 Cervicalgia: Secondary | ICD-10-CM | POA: Insufficient documentation

## 2018-07-16 NOTE — Therapy (Signed)
[place], [place], [address] Phone: [phone]   Fax:  [phone]  Physical Therapy Treatment  Patient Details  Name: Kirsten Torres MRN: [id number] Date of Birth: 31-Dec-1970 Referring Provider (PT): Lanae Boast FNP   Encounter Date: 07/16/2018  PT End of Session - 07/16/18 0809    Visit Number  9    Number of Visits  12    Date for PT Re-Evaluation  07/30/18    Authorization Type  MCD  first authorization period  reauthorization of 12 visits total end date 07-30-18    Authorization - Visit Number  10    Authorization - Number of Visits  12    PT Start Time  0805    PT Stop Time  0845    PT Time Calculation (min)  40 min       Past Medical History:  Diagnosis Date  . Genital mutilation, female   . GERD (gastroesophageal reflux disease)   . SVD (spontaneous vaginal delivery) 05/28/2011    Past Surgical History:  Procedure Laterality Date  . genital mutilation    . TONSILLECTOMY  2004    There were no vitals filed for this visit.  Subjective Assessment - 07/16/18 0813    Subjective  4/10 top of shoulder, hurts more with movement and it cracks.     Currently in Pain?  Yes    Pain Score  4     Pain Location  Shoulder    Pain Orientation  Right   superior   Pain Descriptors / Indicators  --   cracking    Aggravating Factors   any movement of the shoulder, laying down    Pain Relieving Factors  rest                        [facility] Adult PT Treatment/Exercise - 07/16/18 0001      Neck Exercises: Supine   Neck Retraction Limitations  chin tuck with arm circles, and alt UE flex/ext -added basic dead bug with Tra contract for core strength    Other Supine Exercise  chin tuck x 10 with 5 sec hold,, practice of supine flexion hold 10 sec x 3     Other Supine Exercise  supine scapular stabilzers with red t band bil flex, horizontal abd, diagonals and Bil ER      Shoulder  Exercises: Standing   External Rotation  15 reps;Both;Strengthening;Theraband   x 2   Theraband Level (Shoulder External Rotation)  Level 2 (Red)    Extension  Strengthening;Both;15 reps;Theraband    Theraband Level (Shoulder Extension)  Level 2 (Red)   x2   Row  Strengthening;Both;15 reps;Theraband   x2   Theraband Level (Shoulder Row)  Level 2 (Red)    Other Standing Exercises  serratus punch with green t band  x 20 , green t band on wall with       Neck Exercises: Stretches   Upper Trapezius Stretch  Right;Left;2 reps;20 seconds    Levator Stretch  Right;Left;1 rep;2 reps;20 seconds    Corner Stretch  2 reps;30 seconds               PT Short Term Goals - 06/19/18 1127      PT SHORT TERM GOAL #1   Title  Pt will be independent with initial HEP    Baseline  Pt are independent    Time  3    Period  Weeks    Status  Achieved      PT SHORT TERM GOAL #2   Title  Pt will be able to sleep with 50% greater comfort on right side at night for sleep    Baseline  Pt is 70 % better with sleep on right side and sleeping at night as pt reports    Time  3    Period  Weeks    Status  Achieved      PT SHORT TERM GOAL #3   Title  Pt will increase right cervical side bend and rotation 10 degrees to increase comfort while doing household chores    Baseline  right side bend 45 and rotation 55 at today     Time  3    Period  Weeks    Status  Achieved        PT Long Term Goals - 07/12/18 0944      PT LONG TERM GOAL #1   Title  Pt will be independent with advanced HEP for cervical stabilization     Baseline  Pt progressing to advanced HEP , needs reinforcement    Time  4    Period  Weeks    Status  On-going    Target Date  07/30/18      PT LONG TERM GOAL #2   Title  Pt will be able to stand and perform vacuuming and sweeping without exacerbating pain greater than 3/10    Baseline  Pt now vacuuming but with pain 5-6/10     Time  4    Period  Weeks    Status  On-going     Target Date  07/30/18      PT LONG TERM GOAL #3   Title  Pt will be able to sit at computer for 2 hours in order to complete school and household work with proper posture and appropriate breaks and pain no greater than 3/10    Baseline  Pt able to sit at computer for 2 hours now at most 6/10 max reminded to move every hour    Time  4    Period  Weeks    Status  Partially Met    Target Date  07/30/18      PT LONG TERM GOAL #4   Title  Pt will be able to perform supine neck flexion one inch from horizontal in order to demonstrate increased deep neck flexor strength    Baseline  Pt now able to hold 15 sec 1 inch from horizontal ( normal 35 sec) from from 07-10-18    Time  4    Period  Weeks    Status  On-going    Target Date  07/30/18      PT LONG TERM GOAL #5   Title  "Pt will not wake due to pain  while turning in bed while sleeping at night    Baseline  Pt able to sleep through the night, sitll has pain when sleeping on right shoulder     Time  4    Period  Weeks    Status  Achieved    Target Date  07/30/18            Plan - 07/16/18 0840    Clinical Impression Statement  Pt reports less pain intensity. She reports pain with all shoulder movements and cracking at top of right shoulder. Time spent with review of HEP. Minor cues required.     PT  Next Visit Plan  DC next visit     PT Home Exercise Plan  neck stretchs, shoulder retraction and neck retraction, yellow band scap retract with ER standing scapular stabilizers, pec sttrech    Consulted and Agree with Plan of Care  Patient       Patient will benefit from skilled therapeutic intervention in order to improve the following deficits and impairments:  Pain, Postural dysfunction, Improper body mechanics, Impaired UE functional use, Increased muscle spasms, Decreased strength, Decreased range of motion  Visit Diagnosis: Muscle weakness (generalized)  Abnormal posture  Cervicalgia     Problem List Patient Active  Problem List   Diagnosis Date Noted  . Chronic constipation 01/19/2017  . Dysuria 01/19/2017  . Generalized abdominal pain 01/19/2017  . Pain in the chest 11/16/2015  . Gastroesophageal reflux disease without esophagitis 11/16/2015  . Other fatigue 11/16/2015    [name], PTA 07/16/2018, 8:43 AM  [facility] [address] [place], [place], [address] Phone: [phone]   Fax:  [phone]  Name: TAIA BRAMLETT MRN: [id number] Date of Birth: [birth year]

## 2018-07-17 ENCOUNTER — Encounter: Payer: Medicaid Other | Admitting: Physical Therapy

## 2018-07-19 ENCOUNTER — Encounter: Payer: Self-pay | Admitting: Physical Therapy

## 2018-07-19 ENCOUNTER — Ambulatory Visit: Payer: Medicaid Other | Admitting: Physical Therapy

## 2018-07-19 DIAGNOSIS — M542 Cervicalgia: Secondary | ICD-10-CM

## 2018-07-19 DIAGNOSIS — R293 Abnormal posture: Secondary | ICD-10-CM | POA: Diagnosis not present

## 2018-07-19 DIAGNOSIS — M6281 Muscle weakness (generalized): Secondary | ICD-10-CM

## 2018-07-19 NOTE — Therapy (Addendum)
[place], [place], [address] Phone: [phone]   Fax:  [phone]  Physical Therapy Treatment/Discharge Note  Patient Details  Name: Kirsten Torres MRN: [id number] Date of Birth: June 29, 1971 Referring Provider (PT): Lanae Boast FNP   Encounter Date: 07/19/2018  PT End of Session - 07/19/18 0938    Visit Number  10    Number of Visits  12    Date for PT Re-Evaluation  07/30/18    Authorization Type  MCD  first authorization period  reauthorization of 12 visits total end date 07-30-18    Authorization - Visit Number  10    Authorization - Number of Visits  12    PT Start Time  [phone]    PT Stop Time  1015    PT Time Calculation (min)  39 min    Activity Tolerance  Patient tolerated treatment well    Behavior During Therapy  [facility] for tasks assessed/performed       Past Medical History:  Diagnosis Date  . Genital mutilation, female   . GERD (gastroesophageal reflux disease)   . SVD (spontaneous vaginal delivery) 05/28/2011    Past Surgical History:  Procedure Laterality Date  . genital mutilation    . TONSILLECTOMY  2004    There were no vitals filed for this visit.  Subjective Assessment - 07/19/18 0948    Subjective  Just tightness today,      Currently in Pain?  No/denies   only tightness   Pain Score  0-No pain    Pain Location  Shoulder    Pain Orientation  Right    Pain Descriptors / Indicators  Tightness    Pain Type  Chronic pain    Pain Onset  More than a month ago    Pain Frequency  Intermittent         [facility] PT Assessment - 07/19/18 0001      Assessment   Medical Diagnosis  neck pain in right    Referring Provider (PT)  Lanae Boast FNP      Observation/Other Assessments   Focus on Therapeutic Outcomes (FOTO)   FOTO not taken      AROM   Right Shoulder Flexion  158 Degrees    Right Shoulder ABduction  155 Degrees    Left Shoulder Flexion  160 Degrees    Left  Shoulder ABduction  152 Degrees    Cervical Flexion  65    Cervical Extension  55    Cervical - Right Side Bend  60    Cervical - Left Side Bend  58    Cervical - Right Rotation  67    Cervical - Left Rotation  70      Strength   Overall Strength Comments  supine neck flexion 26 sec    Right Shoulder Flexion  5/5    Right Shoulder ABduction  5/5    Left Shoulder Flexion  5/5    Left Shoulder ABduction  5/5                   [facility] Adult PT Treatment/Exercise - 07/19/18 1000      Self-Care   Self-Care  Other Self-Care Comments    Other Self-Care Comments   discussed 3 components of exericse, flexibiilty, cardio, and strength. and community wellness      Exercises   Exercises  Neck      Neck Exercises: Standing   Other Standing Exercises  use of blue t band UE flexion and abd bil 15 x 2       Neck Exercises: Supine   Neck Retraction Limitations  chin tuck with arm circles, and alt UE flex/ext -added basic dead bug with Tra contract for core strength    Other Supine Exercise  chin tuck x 10 with 5 sec hold,, practice of supine flexion hold 10 sec x 3     Other Supine Exercise  supine scapular stabilzers with red t band bil flex, horizontal abd, diagonals and Bil ER      Shoulder Exercises: Standing   External Rotation  15 reps;Both;Strengthening;Theraband   x 2   Theraband Level (Shoulder External Rotation)  Level 2 (Red);Level 4 (Blue)    Extension  Strengthening;Both;15 reps;Theraband    Theraband Level (Shoulder Extension)  Level 2 (Red);Level 4 (Blue)   x2   Row  Strengthening;Both;15 reps;Theraband   x2   Theraband Level (Shoulder Row)  Level 2 (Red);Level 4 (Blue)    Other Standing Exercises  serratus punch with green t band  x 20 , green t band on wall with       Neck Exercises: Stretches   Upper Trapezius Stretch  Right;Left;2 reps;20 seconds    Levator Stretch  Right;Left;1 rep;2 reps;20 seconds    Levator Stretch Limitations  use of stretch out strap  to enhance stretch  and use of chair for thoracic extension 3 x 10 sec over chair              PT Education - 07/19/18 1106    Education Details  Reviewed HEP and added community wellness and components of exericise before DC education    Person(s) Educated  Patient;Other (comment)   interpreter   Methods  Explanation;Demonstration;Tactile cues;Verbal cues;Handout    Comprehension  Verbalized understanding;Returned demonstration       PT Short Term Goals - 06/19/18 1127      PT SHORT TERM GOAL #1   Title  Pt will be independent with initial HEP    Baseline  Pt are independent    Time  3    Period  Weeks    Status  Achieved      PT SHORT TERM GOAL #2   Title  Pt will be able to sleep with 50% greater comfort on right side at night for sleep    Baseline  Pt is 70 % better with sleep on right side and sleeping at night as pt reports    Time  3    Period  Weeks    Status  Achieved      PT SHORT TERM GOAL #3   Title  Pt will increase right cervical side bend and rotation 10 degrees to increase comfort while doing household chores    Baseline  right side bend 45 and rotation 55 at today     Time  3    Period  Weeks    Status  Achieved        PT Long Term Goals - 07/19/18 0957      PT LONG TERM GOAL #1   Title  Pt will be independent with advanced HEP for cervical stabilization     Time  4    Period  Weeks    Status  Achieved      PT LONG TERM GOAL #2   Title  Pt will be able to stand and perform vacuuming and sweeping without exacerbating pain greater than 3/10  Baseline  Pt  now vacuuming and doing household chores 3/10 or less now    Period  Weeks    Status  Achieved      PT LONG TERM GOAL #3   Title  Pt will be able to sit at computer for 2 hours in order to complete school and household work with proper posture and appropriate breaks and pain no greater than 3/10    Baseline  Pt able to sit at computer for 2 hours now at most 3/10 max reminded to move  every hour    Time  4    Period  Weeks    Status  Achieved      PT LONG TERM GOAL #4   Title   Pt will be able to perform supine neck flexion one inch from horizontal in order to demonstrate increased deep neck flexor strength     Baseline  Pt now able to hold 26 sec 1 inch from horizontal ( normal 35 sec) from from 07-10-18    Time  4    Period  Weeks    Status  Partially Met      PT LONG TERM GOAL #5   Title  "Pt will not wake due to pain  while turning in bed while sleeping at night    Baseline  Pt able to sleep through the night, sitll has pain when sleeping on right shoulder     Time  4    Period  Weeks    Status  Achieved            Plan - 07/19/18 1016    Clinical Impression Statement  Kirsten Torres achieved all LTG and has no complaint of pain this AM but says has up to 3/10 at times but able to work out with exericises to 0/10   Pt is able to perform exericises with blue t band independently and has WNL AROM of cervical  SEe flowchart.  Pt is pleased with current level of function and was also educated on 3 components of exerice.   Pt  has done well and agrees with discharge due to achievement of all goals.    Rehab Potential  Good    PT Frequency  2x / week    PT Duration  4 weeks    PT Treatment/Interventions  ADLs/Self Care Home Management;Cryotherapy;Electrical Stimulation;Iontophoresis 59m/ml Dexamethasone;Moist Heat;Traction;Ultrasound;Therapeutic exercise;Therapeutic activities;Neuromuscular re-education;Patient/family education;Manual techniques;Passive range of motion;Dry needling;Taping;Joint Manipulations    PT Next Visit Plan  DC    PT Home Exercise Plan  neck stretchs, shoulder retraction and neck retraction, yellow band scap retract with ER standing scapular stabilizers, pec sttrech blue t band     Consulted and Agree with Plan of Care  Patient;Other (Comment)   interpretere      Patient will benefit from skilled therapeutic intervention in order to improve  the following deficits and impairments:  Pain, Postural dysfunction, Improper body mechanics, Impaired UE functional use, Increased muscle spasms, Decreased strength, Decreased range of motion  Visit Diagnosis: Muscle weakness (generalized)  Abnormal posture  Cervicalgia     Problem List Patient Active Problem List   Diagnosis Date Noted  . Chronic constipation 01/19/2017  . Dysuria 01/19/2017  . Generalized abdominal pain 01/19/2017  . Pain in the chest 11/16/2015  . Gastroesophageal reflux disease without esophagitis 11/16/2015  . Other fatigue 11/16/2015    LVoncille Lo PT Certified Exercise Expert for the Aging Adult  07/19/18 11:07 AM Phone: [phone]: [phone]  [place], [place], [address] Phone: [phone]   Fax:  [phone]  Name: Kirsten Torres MRN: [id number] Date of Birth: 1970/11/03   PHYSICAL THERAPY DISCHARGE SUMMARY  Visits from Start of Care: 10  Current functional level related to goals / functional outcomes: Achieved all goals  See above   Remaining deficits: Right neck tightness improved with exercise   Education / Equipment: HEP  Plan: Patient agrees to discharge.  Patient goals were met. Patient is being discharged due to meeting the stated rehab goals.  ?????    And being pleased with current functional level  Voncille Lo, PT Certified Exercise Expert for the Aging Adult  07/19/18 11:07 AM Phone: [phone] Fax: [phone]

## 2018-09-10 MED FILL — LINZESS 145 MCG CAPSULE: 145 | 30 days supply | Qty: 30 | Fill #1

## 2018-10-15 ENCOUNTER — Ambulatory Visit: Payer: Medicaid Other | Admitting: Family Medicine

## 2018-10-19 ENCOUNTER — Ambulatory Visit (INDEPENDENT_AMBULATORY_CARE_PROVIDER_SITE_OTHER): Payer: Medicaid Other | Admitting: Family Medicine

## 2018-10-19 ENCOUNTER — Encounter: Payer: Self-pay | Admitting: Family Medicine

## 2018-10-19 VITALS — BP 99/83 | HR 92 | Temp 99.0°F | Resp 16 | Ht 65.0 in | Wt 192.0 lb

## 2018-10-19 DIAGNOSIS — K5909 Other constipation: Secondary | ICD-10-CM

## 2018-10-19 DIAGNOSIS — D509 Iron deficiency anemia, unspecified: Secondary | ICD-10-CM | POA: Diagnosis not present

## 2018-10-19 DIAGNOSIS — R5383 Other fatigue: Secondary | ICD-10-CM | POA: Diagnosis not present

## 2018-10-19 DIAGNOSIS — G629 Polyneuropathy, unspecified: Secondary | ICD-10-CM | POA: Diagnosis not present

## 2018-10-19 DIAGNOSIS — K219 Gastro-esophageal reflux disease without esophagitis: Secondary | ICD-10-CM | POA: Diagnosis not present

## 2018-10-19 MED ORDER — OMEPRAZOLE 20 MG PO CPDR: 20.0000 mg | DELAYED_RELEASE_CAPSULE | Freq: Every day | ORAL | 1 refills | Status: DC

## 2018-10-19 MED ORDER — LUBIPROSTONE 24 MCG PO CAPS: 24.0000 ug | ORAL_CAPSULE | Freq: Two times a day (BID) | ORAL | 2 refills | Status: DC

## 2018-10-19 MED ORDER — HYDROCORTISONE 2.5 % RE CREA: 1.0000 "application " | TOPICAL_CREAM | Freq: Every day | RECTAL | 1 refills | Status: DC

## 2018-10-19 MED FILL — AMITIZA 24 MCG CAPSULES: 24 | 30 days supply | Qty: 60 | Fill #0

## 2018-10-19 MED FILL — OMEPRAZOLE 20 MG CAP: 20 | 30 days supply | Qty: 30 | Fill #0

## 2018-10-19 MED FILL — HYDROCORTISONE 2.5% CREAM: 2.5 | 15 days supply | Qty: 30 | Fill #0

## 2018-10-19 NOTE — Progress Notes (Signed)
Patient [facility]   Progress Note: General Provider: Lanae Boast, FNP  SUBJECTIVE:   Kirsten Torres is a 48 y.o. female who  has a past medical history of Genital mutilation, female, GERD (gastroesophageal reflux disease), and SVD (spontaneous vaginal delivery) (05/28/2011).. Patient presents today for Follow-up and Numbness (in both arms )  Patient is accompanied by her husband who serves as the interpreter.  She states that she is having numbness in both arms.  History of cervicalgia and was seen in outpatient rehab for this.  She states that she had minimal relief with this. 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 Feeling is described as painful and can last for hours to days.  She also states a history of chronic constipation.  Currently takes Linzess without adequate relief.  She was evaluated by GI for anal fissures. She has not followed with them in a year. Last bowel movement yesterday.  Review of Systems  Constitutional: Negative.   HENT: Negative.   Eyes: Negative.   Respiratory: Negative.   Cardiovascular: Negative.   Gastrointestinal: Positive for constipation.  Genitourinary: Negative.   Musculoskeletal: Positive for myalgias.  Skin: Negative.   Neurological: Positive for tingling.  Psychiatric/Behavioral: Negative.      OBJECTIVE: BP 99/83 (BP Location: Left Arm, Patient Position: Sitting, Cuff Size: Large)   Pulse 92   Temp 99 F (37.2 C) (Oral)   Resp 16   Ht 5\' 5"  (1.651 m)   Wt 192 lb (87.1 kg)   LMP 09/24/2018   SpO2 100%   BMI 31.95 kg/m   Wt Readings from Last 3 Encounters:  10/19/18 192 lb (87.1 kg)  04/12/18 190 lb (86.2 kg)  01/03/18 189 lb (85.7 kg)     Physical Exam Vitals signs and nursing note reviewed.  Constitutional:      General: She is not in acute distress.    Appearance: She is well-developed.  HENT:     Head: Normocephalic and  atraumatic.  Eyes:     Conjunctiva/sclera: Conjunctivae normal.     Pupils: Pupils are equal, round, and reactive to light.  Neck:     Musculoskeletal: Normal range of motion.  Cardiovascular:     Rate and Rhythm: Normal rate and regular rhythm.     Heart sounds: Normal heart sounds.  Pulmonary:     Effort: Pulmonary effort is normal. No respiratory distress.     Breath sounds: Normal breath sounds.  Abdominal:     General: Bowel sounds are normal. There is no distension.     Palpations: Abdomen is soft.     Tenderness: There is abdominal tenderness (lower left and right).  Musculoskeletal: Normal range of motion.  Skin:    General: Skin is warm and dry.  Neurological:     Mental Status: She is alert and oriented to person, place, and time.  Psychiatric:        Mood and Affect: Mood normal.        Behavior: Behavior normal.        Thought Content: Thought content normal.        Judgment: Judgment normal.     ASSESSMENT/PLAN:   1. Chronic constipation Patient to do trial of amitiza. Recommend follow up with GI for further evaluation.  - hydrocortisone (ANUSOL-HC) 2.5 % rectal cream; Place 1 application rectally at bedtime.  Dispense: 28.35 g; Refill: 1 - lubiprostone (AMITIZA) 24 MCG capsule; Take 1  capsule (24 mcg total) by mouth 2 (two) times daily with a meal.  Dispense: 60 capsule; Refill: 2 - TSH  2. Gastroesophageal reflux disease, esophagitis presence not specified Pending labs. Will adjust medications accordingly.    - omeprazole (PRILOSEC) 20 MG capsule; Take 1 capsule (20 mg total) by mouth daily.  Dispense: 30 capsule; Refill: 1  3. Iron deficiency anemia, unspecified iron deficiency anemia type Pending labs. Will adjust medications accordingly.    - Comprehensive metabolic panel - CBC with Differential - Ferritin  4. Neuropathy Pending labs. Will adjust medications accordingly.    - Vitamin B12 - VITAMIN D 25 Hydroxy (Vit-D Deficiency, Fractures)  5.  Other fatigue Pending labs. Will adjust medications accordingly.    - VITAMIN D 25 Hydroxy (Vit-D Deficiency, Fractures)     Return in about 4 weeks (around 11/16/2018) for new medication for constipation.    The patient was given clear instructions to go to ER or return to medical center if symptoms do not improve, worsen or new problems develop. The patient verbalized understanding and agreed with plan of care.   [name]. Kirsten Canary, FNP-BC [facility] [address] [place], [place] [address] [phone]

## 2018-10-19 NOTE — Patient Instructions (Signed)
Neuropathic Pain Neuropathic pain is pain caused by damage to the nerves that are responsible for certain sensations in your body (sensory nerves). The pain can be caused by:  Damage to the sensory nerves that send signals to your spinal cord and brain (peripheral nervous system).  Damage to the sensory nerves in your brain or spinal cord (central nervous system). Neuropathic pain can make you more sensitive to pain. Even a minor sensation can feel very painful. This is usually a long-term condition that can be difficult to treat. The type of pain differs from person to person. It may:  Start suddenly (acute), or it may develop slowly and last for a long time (chronic).  Come and go as damaged nerves heal, or it may stay at the same level for years.  Cause emotional distress, loss of sleep, and a lower quality of life. What are the causes? The most common cause of this condition is diabetes. Many other diseases and conditions can also cause neuropathic pain. Causes of neuropathic pain can be classified as:  Toxic. This is caused by medicines and chemicals. The most common cause of toxic neuropathic pain is damage from cancer treatments (chemotherapy).  Metabolic. This can be caused by: ? Diabetes. This is the most common disease that damages the nerves. ? Lack of vitamin B from long-term alcohol abuse.  Traumatic. Any injury that cuts, crushes, or stretches a nerve can cause damage and pain. A common example is feeling pain after losing an arm or leg (phantom limb pain).  Compression-related. If a sensory nerve gets trapped or compressed for a long period of time, the blood supply to the nerve can be cut off.  Vascular. Many blood vessel diseases can cause neuropathic pain by decreasing blood supply and oxygen to nerves.  Autoimmune. This type of pain results from diseases in which the body's defense system (immune system) mistakenly attacks sensory nerves. Examples of autoimmune diseases  that can cause neuropathic pain include lupus and multiple sclerosis.  Infectious. Many types of viral infections can damage sensory nerves and cause pain. Shingles infection is a common cause of this type of pain.  Inherited. Neuropathic pain can be a symptom of many diseases that are passed down through families (genetic). What increases the risk? You are more likely to develop this condition if:  You have diabetes.  You smoke.  You drink too much alcohol.  You are taking certain medicines, including medicines that kill cancer cells (chemotherapy) or that treat immune system disorders. What are the signs or symptoms? The main symptom is pain. Neuropathic pain is often described as:  Burning.  Shock-like.  Stinging.  Hot or cold.  Itching. How is this diagnosed? No single test can diagnose neuropathic pain. It is diagnosed based on:  Physical exam and your symptoms. Your health care provider will ask you about your pain. You may be asked to use a pain scale to describe how bad your pain is.  Tests. These may be done to see if you have a high sensitivity to pain and to help find the cause and location of any sensory nerve damage. They include: ? Nerve conduction studies to test how well nerve signals travel through your sensory nerves (electrodiagnostic testing). ? Stimulating your sensory nerves through electrodes on your skin and measuring the response in your spinal cord and brain (somatosensory evoked potential).  Imaging studies, such as: ? X-rays. ? CT scan. ? MRI. How is this treated? Treatment for neuropathic pain may change   over time. You may need to try different treatment options or a combination of treatments. Some options include:  Treating the underlying cause of the neuropathy, such as diabetes, kidney disease, or vitamin deficiencies.  Stopping medicines that can cause neuropathy, such as chemotherapy.  Medicine to relieve pain. Medicines may  include: ? Prescription or over-the-counter pain medicine. ? Anti-seizure medicine. ? Antidepressant medicines. ? Pain-relieving patches that are applied to painful areas of skin. ? A medicine to numb the area (local anesthetic), which can be injected as a nerve block.  Transcutaneous nerve stimulation. This uses electrical currents to block painful nerve signals. The treatment is painless.  Alternative treatments, such as: ? Acupuncture. ? Meditation. ? Massage. ? Physical therapy. ? Pain management programs. ? Counseling. Follow these instructions at home: Medicines   Take over-the-counter and prescription medicines only as told by your health care provider.  Do not drive or use heavy machinery while taking prescription pain medicine.  If you are taking prescription pain medicine, take actions to prevent or treat constipation. Your health care provider may recommend that you: ? Drink enough fluid to keep your urine pale yellow. ? Eat foods that are high in fiber, such as fresh fruits and vegetables, whole grains, and beans. ? Limit foods that are high in fat and processed sugars, such as fried or sweet foods. ? Take an over-the-counter or prescription medicine for constipation. Lifestyle   Have a good support system at home.  Consider joining a chronic pain support group.  Do not use any products that contain nicotine or tobacco, such as cigarettes and e-cigarettes. If you need help quitting, ask your health care provider.  Do not drink alcohol. General instructions  Learn as much as you can about your condition.  Work closely with all your health care providers to find the treatment plan that works best for you.  Ask your health care provider what activities are safe for you.  Keep all follow-up visits as told by your health care provider. This is important. Contact a health care provider if:  Your pain treatments are not working.  You are having side effects  from your medicines.  You are struggling with tiredness (fatigue), mood changes, depression, or anxiety. Summary  Neuropathic pain is pain caused by damage to the nerves that are responsible for certain sensations in your body (sensory nerves).  Neuropathic pain may come and go as damaged nerves heal, or it may stay at the same level for years.  Neuropathic pain is usually a long-term condition that can be difficult to treat. Consider joining a chronic pain support group. This information is not intended to replace advice given to you by your health care provider. Make sure you discuss any questions you have with your health care provider. Document Released: 05/26/2004 Document Revised: 09/15/2017 Document Reviewed: 09/15/2017 Elsevier Interactive Patient Education  2019 [place] oral capsule What is this medicine? LUBIPROSTONE (loo bi PROS tone) is a laxative. It is used to treat chronic constipation and constipation caused by opioids (certain prescription pain medicines). It is also used to treat adult women with irritable bowel syndrome who have constipation. This medicine may be used for other purposes; ask your health care provider or pharmacist if you have questions. COMMON BRAND NAME(S): Amitiza What should I tell my health care provider before I take this medicine? They need to know if you have any of these conditions: -cancer or tumor in abdomen, intestine, or stomach -history of bowel obstruction or  adhesions -history of stool (fecal) impaction -liver disease -an unusual or allergic reaction to lubiprostone, other medicines, foods, dyes, or preservatives -pregnant or trying to get pregnant -breast-feeding How should I use this medicine? Take this medicine by mouth with a glass of water. Follow the directions on the prescription label. Do not cut, crush or chew this medicine. Take this medicine with food. Take your medicine at regular intervals. Do not take  your medicine more often than directed. Do not stop taking except on your doctor's advice. Talk to your pediatrician regarding the use of this medicine in children. Special care may be needed. Overdosage: If you think you have taken too much of this medicine contact a poison control center or emergency room at once. NOTE: This medicine is only for you. Do not share this medicine with others. What if I miss a dose? If you miss a dose, take it as soon as you can. If it is almost time for your next dose, take only that dose. Do not take double or extra doses. What may interact with this medicine? -medicines that treat diarrhea -methadone -other medicines for constipation This list may not describe all possible interactions. Give your health care provider a list of all the medicines, herbs, non-prescription drugs, or dietary supplements you use. Also tell them if you smoke, drink alcohol, or use illegal drugs. Some items may interact with your medicine. What should I watch for while using this medicine? Visit your doctor for regular check ups. Tell your doctor if your symptoms do not get better or if they get worse. What side effects may I notice from receiving this medicine? Side effects that you should report to your doctor or health care professional as soon as possible: -allergic reactions like skin rash, itching or hives, swelling of the face, lips, or tongue -feeling faint or lightheaded, falls -new or worsening stomach pain -severe or prolonged diarrhea -vomiting Side effects that usually do not require medical attention (report to your doctor or health care professional if they continue or are bothersome): -headache -loose stools -nausea This list may not describe all possible side effects. Call your doctor for medical advice about side effects. You may report side effects to FDA at 1-800-FDA-1088. Where should I keep my medicine? Keep out of the reach of children. Store at room  temperature between 15 and 30 degrees C (59 and 86 degrees F). Throw away any unused medicine after the expiration date. NOTE: This sheet is a summary. It may not cover all possible information. If you have questions about this medicine, talk to your doctor, pharmacist, or health care provider.  2019 Elsevier/Gold Standard (2015-10-01 11:04:22)

## 2018-10-20 LAB — COMPREHENSIVE METABOLIC PANEL
ALT: 6 IU/L (ref 0–32)
AST: 11 IU/L (ref 0–40)
Albumin/Globulin Ratio: 1.5 (ref 1.2–2.2)
Albumin: 4.1 g/dL (ref 3.8–4.8)
Alkaline Phosphatase: 64 IU/L (ref 39–117)
BUN/Creatinine Ratio: 12 (ref 9–23)
BUN: 9 mg/dL (ref 6–24)
Bilirubin Total: 0.3 mg/dL (ref 0.0–1.2)
CO2: 22 mmol/L (ref 20–29)
Calcium: 9.6 mg/dL (ref 8.7–10.2)
Chloride: 101 mmol/L (ref 96–106)
Creatinine, Ser: 0.73 mg/dL (ref 0.57–1.00)
GFR calc Af Amer: 115 mL/min/{1.73_m2} (ref >59)
GFR calc non Af Amer: 100 mL/min/{1.73_m2} (ref >59)
Globulin, Total: 2.7 g/dL (ref 1.5–4.5)
Glucose: 95 mg/dL (ref 65–99)
Potassium: 3.9 mmol/L (ref 3.5–5.2)
Sodium: 138 mmol/L (ref 134–144)
Total Protein: 6.8 g/dL (ref 6.0–8.5)

## 2018-10-20 LAB — CBC WITH DIFFERENTIAL/PLATELET
Basophils Absolute: 0 10*3/uL (ref 0.0–0.2)
Basos: 0 %
EOS (ABSOLUTE): 0 10*3/uL (ref 0.0–0.4)
Eos: 1 %
Hematocrit: 37.1 % (ref 34.0–46.6)
Hemoglobin: 12.5 g/dL (ref 11.1–15.9)
Immature Grans (Abs): 0 10*3/uL (ref 0.0–0.1)
Immature Granulocytes: 0 %
Lymphocytes Absolute: 1.9 10*3/uL (ref 0.7–3.1)
Lymphs: 37 %
MCH: 28.3 pg (ref 26.6–33.0)
MCHC: 33.7 g/dL (ref 31.5–35.7)
MCV: 84 fL (ref 79–97)
Monocytes Absolute: 0.4 10*3/uL (ref 0.1–0.9)
Monocytes: 8 %
Neutrophils Absolute: 2.8 10*3/uL (ref 1.4–7.0)
Neutrophils: 54 %
Platelets: 238 10*3/uL (ref 150–450)
RBC: 4.42 x10E6/uL (ref 3.77–5.28)
RDW: 12.6 % (ref 11.7–15.4)
WBC: 5.2 10*3/uL (ref 3.4–10.8)

## 2018-10-20 LAB — VITAMIN B12: Vitamin B-12: 515 pg/mL (ref 232–1245)

## 2018-10-20 LAB — TSH: TSH: 0.989 u[IU]/mL (ref 0.450–4.500)

## 2018-10-20 LAB — FERRITIN: Ferritin: 14 ng/mL — ABNORMAL LOW (ref 15–150)

## 2018-10-20 LAB — VITAMIN D 25 HYDROXY (VIT D DEFICIENCY, FRACTURES): Vit D, 25-Hydroxy: 16.3 ng/mL — ABNORMAL LOW (ref 30.0–100.0)

## 2018-10-24 ENCOUNTER — Telehealth: Payer: Self-pay

## 2018-10-24 NOTE — Telephone Encounter (Signed)
Called, no answer left a voicemail for patient to call back. Thanks!

## 2018-10-24 NOTE — Telephone Encounter (Signed)
-----   Message from Lanae Boast, [place] sent at 10/23/2018  9:10 PM EST ----- Iron and vitamins d levels are very low. Due to chronic constipation, I would like her to come to the day hospital for an iron infusion.

## 2018-10-24 NOTE — Telephone Encounter (Signed)
Can you please place orders?

## 2018-10-24 NOTE — Telephone Encounter (Signed)
Called and spoke with patient. Advised that vitamin D levels and iron and low due to chronic constipation. Advised that we would like her to come in for an iron infusion. She has been scheduled for Monday 10/29/2018 @10am . Thanks!

## 2018-10-26 ENCOUNTER — Other Ambulatory Visit: Payer: Self-pay | Admitting: Family Medicine

## 2018-10-26 MED ORDER — VITAMIN D (ERGOCALCIFEROL) 1.25 MG (50000 UNIT) PO CAPS: 50000.0000 [IU] | ORAL_CAPSULE | ORAL | 0 refills | Status: AC

## 2018-10-26 MED FILL — VIT D2 1.25 MG (50,000 UNIT: 1.25 MG | 28 days supply | Qty: 4 | Fill #0

## 2018-10-26 NOTE — Progress Notes (Signed)
Order for fereheme and vitamin d

## 2018-10-29 ENCOUNTER — Ambulatory Visit (HOSPITAL_COMMUNITY)
Admission: RE | Admit: 2018-10-29 | Discharge: 2018-10-29 | Disposition: A | Discharge status: Home / Self Care | Payer: Medicaid Other | Source: Ambulatory Visit | Attending: Family Medicine | Admitting: Family Medicine

## 2018-10-29 DIAGNOSIS — R002 Palpitations: Secondary | ICD-10-CM | POA: Insufficient documentation

## 2018-10-29 DIAGNOSIS — K5909 Other constipation: Secondary | ICD-10-CM | POA: Insufficient documentation

## 2018-10-29 DIAGNOSIS — T7840XD Allergy, unspecified, subsequent encounter: Secondary | ICD-10-CM | POA: Diagnosis not present

## 2018-10-29 DIAGNOSIS — R05 Cough: Secondary | ICD-10-CM | POA: Insufficient documentation

## 2018-10-29 DIAGNOSIS — Z789 Other specified health status: Secondary | ICD-10-CM | POA: Diagnosis not present

## 2018-10-29 DIAGNOSIS — D509 Iron deficiency anemia, unspecified: Secondary | ICD-10-CM | POA: Insufficient documentation

## 2018-10-29 MED ORDER — DIPHENHYDRAMINE HCL 25 MG PO CAPS
25.0000 mg | ORAL_CAPSULE | Freq: Once | ORAL | Status: AC
  Administered 2018-10-29: 25 mg via ORAL
  Filled 2018-10-29: qty 1

## 2018-10-29 MED ORDER — SODIUM CHLORIDE 0.9 % IV SOLN
INTRAVENOUS | Status: DC | PRN
  Administered 2018-10-29: 250 mL via INTRAVENOUS

## 2018-10-29 MED ORDER — SODIUM CHLORIDE 0.9 % IV SOLN
510.0000 mg | INTRAVENOUS | Status: DC
  Administered 2018-10-29: 510 mg via INTRAVENOUS
  Filled 2018-10-29: qty 17

## 2018-10-29 NOTE — Discharge Instructions (Signed)

## 2018-10-29 NOTE — Progress Notes (Signed)
Patient received Feraheme via a PIV. At the end of infusion, patient started coughing, the sclera of both eyes became red, patient complained of heart palpitations. Infusion was stopped immediately, vital signs were taken, nothing remarkable found  and the provider was notified. Per provider patient was given 25 mg of PO Benadryl. Patient was observed for at least 30 minutes post reaction to the infusion. On discharge, vitals were stable, discharge instructions given, verbalized understanding. Patient alert, oriented and ambulatory at the time of discharge. Patient eft the day hospital in the company of her husband.

## 2018-11-01 ENCOUNTER — Encounter: Payer: Self-pay | Admitting: Family Medicine

## 2018-11-01 ENCOUNTER — Ambulatory Visit (INDEPENDENT_AMBULATORY_CARE_PROVIDER_SITE_OTHER): Payer: Medicaid Other | Admitting: Family Medicine

## 2018-11-01 VITALS — BP 97/69 | HR 110 | Temp 98.6°F | Resp 14 | Ht 65.0 in | Wt 194.0 lb

## 2018-11-01 DIAGNOSIS — D509 Iron deficiency anemia, unspecified: Secondary | ICD-10-CM

## 2018-11-01 DIAGNOSIS — Z789 Other specified health status: Secondary | ICD-10-CM | POA: Diagnosis not present

## 2018-11-01 DIAGNOSIS — K5909 Other constipation: Secondary | ICD-10-CM | POA: Diagnosis not present

## 2018-11-01 DIAGNOSIS — T7840XD Allergy, unspecified, subsequent encounter: Secondary | ICD-10-CM

## 2018-11-01 DIAGNOSIS — T7840XA Allergy, unspecified, initial encounter: Secondary | ICD-10-CM

## 2018-11-01 MED ORDER — CETIRIZINE HCL 10 MG PO TABS: 10.0000 mg | ORAL_TABLET | Freq: Every day | ORAL | 11 refills | Status: DC

## 2018-11-01 MED ORDER — LINACLOTIDE 145 MCG PO CAPS: 145.0000 ug | ORAL_CAPSULE | Freq: Every day | ORAL | 1 refills | Status: DC

## 2018-11-01 MED FILL — CETIRIZINE HCL 10 MG TABS: 10 | 30 days supply | Qty: 30 | Fill #0

## 2018-11-01 MED FILL — LINZESS 145 MCG CAPSULE: 145 | 30 days supply | Qty: 30 | Fill #0

## 2018-11-01 NOTE — Patient Instructions (Signed)
We will check your iron levels today.   Iron-Rich Diet  Iron is a mineral that helps your body to produce hemoglobin. Hemoglobin is a protein in red blood cells that carries oxygen to your body's tissues. Eating too little iron may cause you to feel weak and tired, and it can increase your risk of infection. Iron is naturally found in many foods, and many foods have iron added to them (iron-fortified foods). You may need to follow an iron-rich diet if you do not have enough iron in your body due to certain medical conditions. The amount of iron that you need each day depends on your age, your sex, and any medical conditions you have. Follow instructions from your health care provider or a diet and nutrition specialist (dietitian) about how much iron you should eat each day. What are tips for following this plan? Reading food labels  Check food labels to see how many milligrams (mg) of iron are in each serving. Cooking  Cook foods in pots and pans that are made from iron.  Take these steps to make it easier for your body to absorb iron from certain foods: ? Soak beans overnight before cooking. ? Soak whole grains overnight and drain them before using. ? Ferment flours before baking, such as by using yeast in bread dough. Meal planning  When you eat foods that contain iron, you should eat them with foods that are high in vitamin C. These include oranges, peppers, tomatoes, potatoes, and mango. Vitamin C helps your body to absorb iron. General information  Take iron supplements only as told by your health care provider. An overdose of iron can be life-threatening. If you were prescribed iron supplements, take them with orange juice or a vitamin C supplement.  When you eat iron-fortified foods or take an iron supplement, you should also eat foods that naturally contain iron, such as meat, poultry, and fish. Eating naturally iron-rich foods helps your body to absorb the iron that is added to  other foods or contained in a supplement.  Certain foods and drinks prevent your body from absorbing iron properly. Avoid eating these foods in the same meal as iron-rich foods or with iron supplements. These foods include: ? Coffee, black tea, and red wine. ? Milk, dairy products, and foods that are high in calcium. ? Beans and soybeans. ? Whole grains. What foods should I eat? Fruits Prunes. Raisins. Eat fruits high in vitamin C, such as oranges, grapefruits, and strawberries, alongside iron-rich foods. Vegetables Spinach (cooked). Green peas. Broccoli. Fermented vegetables. Eat vegetables high in vitamin C, such as leafy greens, potatoes, bell peppers, and tomatoes, alongside iron-rich foods. Grains Iron-fortified breakfast cereal. Iron-fortified whole-wheat bread. Enriched rice. Sprouted grains. Meats and other proteins Beef liver. Oysters. Beef. Shrimp. [place]. Chicken. Crum. Sardines. Chickpeas. Nuts. Tofu. Pumpkin seeds. Beverages Tomato juice. Fresh orange juice. Prune juice. Hibiscus tea. Fortified instant breakfast shakes. Sweets and desserts Blackstrap molasses. Seasonings and condiments Tahini. Fermented soy sauce. Other foods Wheat germ. The items listed above may not be a complete list of recommended foods and beverages. Contact a dietitian for more information. What foods should I avoid? Grains Whole grains. Bran cereal. Bran flour. Oats. Meats and other proteins Soybeans. Products made from soy protein. Black beans. Lentils. Mung beans. Split peas. Dairy Milk. Cream. Cheese. Yogurt. Cottage cheese. Beverages Coffee. Black tea. Red wine. Sweets and desserts Cocoa. Chocolate. Ice cream. Other foods Basil. Oregano. Large amounts of parsley. The items listed above may not be  a complete list of foods and beverages to avoid. Contact a dietitian for more information. Summary  Iron is a mineral that helps your body to produce hemoglobin. Hemoglobin is a protein in  red blood cells that carries oxygen to your body's tissues.  Iron is naturally found in many foods, and many foods have iron added to them (iron-fortified foods).  When you eat foods that contain iron, you should eat them with foods that are high in vitamin C. Vitamin C helps your body to absorb iron.  Certain foods and drinks prevent your body from absorbing iron properly, such as whole grains and dairy products. You should avoid eating these foods in the same meal as iron-rich foods or with iron supplements. This information is not intended to replace advice given to you by your health care provider. Make sure you discuss any questions you have with your health care provider. Document Released: 04/12/2005 Document Revised: 07/25/2017 Document Reviewed: 07/25/2017 Elsevier Interactive Patient Education  2019 [employer] Inc[employer]    Chronic Constipation  Chronic constipation is a condition in which a person has three or fewer bowel movements a week, for three months or longer. This condition is especially common in older adults. The two main kinds of chronic constipation are secondary constipation and functional constipation. Secondary constipation results from another condition or a treatment. Functional constipation, also called primary or idiopathic constipation, is divided into three types:  Normal transit constipation. In this type, movement of stool through the colon (stool transit) occurs normally.  Slow transit constipation. In this type, stool moves slowly through the colon.  Outlet constipation or pelvic floor dysfunction. In this type, the nerves and muscles that empty the rectum do not work normally. What are the causes? Causes of secondary constipation may include:  Failing to drink enough fluid, eat enough food or fiber, or get physically active.  Pregnancy.  A tear in the anus (anal fissure).  Blockage in the bowel (bowel obstruction).  Narrowing of the bowel (bowel  stricture).  Having a long-term medical condition, such as: ? Diabetes. ? Hypothyroidism. ? Multiple sclerosis. ? Parkinson disease. ? Stroke. ? Spinal cord injury. ? Dementia. ? Colon cancer. ? Inflammatory bowel disease (IBD). ? Iron-deficiency anemia. ? Outward collapse of the rectum (rectal prolapse). ? Hemorrhoids.  Taking certain medicines, including: ? Narcotics. These are a certain type of prescription pain medicine. ? Antacids. ? Iron supplements. ? Water pills (diuretics). ? Certain blood pressure medicines. ? Anti-seizure medicines. ? Antidepressants. ? Medicines for Parkinson disease. The cause of functional constipation is not known, but some conditions are associated with it. These conditions include:  Stress.  Problems in the nerves and muscles that control stool transit.  Weak or impaired pelvic floor muscles. What increases the risk? You may be at higher risk for chronic constipation if you:  Are older than age 31.  Are female.  Live in a long-term care facility.  Do not get much exercise or physical activity (have a sedentary lifestyle).  Do not drink enough fluids.  Do not eat enough food, especially fiber.  Have a long-term disease.  Have a mental health disorder or eating disorder.  Take many medicines. What are the signs or symptoms? The main symptom of chronic constipation is having three or fewer bowel movements a week for several weeks. Other signs and symptoms may vary from person to person. These include:  Pushing hard (straining) to pass stool.  Painful bowel movements.  Having hard or lumpy stools.  Having lower belly discomfort, such as cramps or bloating.  Being unable to have a bowel movement when you feel the urge.  Feeling like you still need to pass stool after a bowel movement.  Feeling that you have something in your rectum that is blocking or preventing bowel movements.  Seeing blood on the toilet paper or in  your stool.  Worsening confusion (in older adults). How is this diagnosed? This condition may be diagnosed based on:  Symptoms and medical history. You will be asked about your symptoms, lifestyle, diet, and any medicines that you are taking.  Physical exam. ? Your belly (abdomen) will be examined. ? A digital rectal exam may be done. For this exam, a health care provider places a lubricated, gloved finger into the rectum.  Other tests to check for any underlying causes of your constipation. These may be ordered if you have bleeding in your rectum, weight loss, or a family history of colon cancer. In these cases, you may have: ? Imaging studies of the colon. These may include X-ray, ultrasound, or CT scan. ? Blood tests. ? A procedure to examine the inside of your colon (colonoscopy). ? More specialized tests to check:  Whether your anal sphincter works well. This is a ring-shaped muscle that controls the closing of the anus.  How well food moves through your colon. ? Tests to measure the nerve signal in your pelvic floor muscles (electromyography). How is this treated? Treatment for chronic constipation depends on the cause. Most often, treatment starts with:  Being more active and getting regular exercise.  Drinking more fluids.  Adding fiber to your diet. Sources of fiber include fruits, vegetables, whole grains, and fiber supplements.  Using medicines such as stool softeners or medicines that increase contractions in your digestive system (pro-motility agents).  Training your pelvic muscles with biofeedback.  Surgery, if there is obstruction. Treatment for secondary chronic constipation depends on the underlying condition. You may need to:  Stop or change some medicines if they cause constipation.  Use a fiber supplement (bulk laxative) or stool softener.  Use prescription laxative. This works by [employer] into your colon (osmotic laxative). You may also need to  see a specialist who treats conditions of the digestive system (gastroenterologist). Follow these instructions at home:   Take over-the-counter and prescription medicines only as told by your health care provider.  If you are taking a laxative, take it as told by your health care provider.  Eat a balanced diet that includes enough fiber. Ask your health care provider to recommend a diet that is right for you.  Drink clear fluids, especially water. Avoid drinking alcohol, caffeine, and soda.  Drink enough fluid to keep your urine pale yellow.  Get some physical activity every day. Ask your health care provider what physical activities are safe for you.  Get colon cancer screenings as told by your health care provider.  Keep all follow-up visits as told by your health care provider. This is important. Contact a health care provider if:  You are having three or fewer bowel movements a week.  Your stools are hard or lumpy.  You notice blood on the toilet paper or in your stool after you have a bowel movement.  You have unexplained weight loss.  You have rectum (rectal) pain.  You have stool leakage.  You experience nausea or vomiting. Get help right away if:  You have rectal bleeding or you pass blood clots.  You have severe rectal  pain.  You have body tissue that pushes out (protrudes) from your anus.  You have severe pain or bloating (distension) in your abdomen.  You have vomiting that you cannot control. Summary  Chronic constipation is a condition in which a person has three or fewer bowel movements a week, for three months or longer.  You may have a higher risk for this condition if you are an older adult, or if you do not drink enough water or get enough physical activity (are sedentary).  Treatment for this condition depends on the cause. Most treatments for chronic constipation include adding fiber to your diet, drinking more fluids, and getting more physical  activity. You may also need to treat any underlying medical conditions or stop or change certain medicines if they cause constipation.  If lifestyle changes do not relieve constipation, your health care provider may recommend taking a laxative. This information is not intended to replace advice given to you by your health care provider. Make sure you discuss any questions you have with your health care provider. Document Released: 03/28/2017 Document Revised: 05/16/2017 Document Reviewed: 05/16/2017 Elsevier Interactive Patient Education  2019 [employer].

## 2018-11-01 NOTE — Progress Notes (Signed)
Patient [name]   Progress Note: Sick Visit Provider: Lanae Boast, FNP  SUBJECTIVE:   Kirsten Torres is a 48 y.o. female who  has a past medical history of Genital mutilation, female, GERD (gastroesophageal reflux disease), and SVD (spontaneous vaginal delivery) (05/28/2011).. Patient presents today for Follow-up (had an allergic reaction from iron infusion here to follow up ) and Constipation (wants medication changed for constipation ) Patient states that she had a reaction to fereheme. Patient states that she had sneezing, itching, cough and SOB with facial swelling. Patient states that the reaction started at the very beginning of the infusion, but she did not tell anyone until the end.  Patient states that she continues to have some itching in her eyes. Otherwise, she is doing better.  Patient states that the Baker Pierini is causing her to have headaches. She would like to go back to linzess. She states that she is not consistent with benefiber daily.  Review of Systems  Constitutional: Negative.   HENT: Negative.   Eyes: Negative.   Respiratory: Negative.   Cardiovascular: Negative.   Gastrointestinal: Positive for constipation.  Genitourinary: Negative.   Musculoskeletal: Negative.   Skin: Negative.   Neurological: Negative.   Psychiatric/Behavioral: Negative.      OBJECTIVE: BP 97/69 (BP Location: Left Arm, Patient Position: Sitting, Cuff Size: Large)   Pulse (!) 110   Temp 98.6 F (37 C) (Oral)   Resp 14   Ht 5\' 5"  (1.651 m)   Wt 194 lb (88 kg)   LMP 10/20/2018   SpO2 100%   BMI 32.28 kg/m   Wt Readings from Last 3 Encounters:  11/01/18 194 lb (88 kg)  10/19/18 192 lb (87.1 kg)  04/12/18 190 lb (86.2 kg)     Physical Exam Vitals signs and nursing note reviewed.  Constitutional:      General: She is not in acute distress.    Appearance: She is well-developed.  HENT:     Head: Normocephalic and atraumatic.  Eyes:       Conjunctiva/sclera: Conjunctivae normal.     Pupils: Pupils are equal, round, and reactive to light.  Neck:     Musculoskeletal: Normal range of motion.  Cardiovascular:     Rate and Rhythm: Normal rate and regular rhythm.     Heart sounds: Normal heart sounds.  Pulmonary:     Effort: Pulmonary effort is normal. No respiratory distress.     Breath sounds: Normal breath sounds.  Abdominal:     General: Bowel sounds are normal. There is no distension.     Palpations: Abdomen is soft.  Musculoskeletal: Normal range of motion.  Skin:    General: Skin is warm and dry.  Neurological:     Mental Status: She is alert and oriented to person, place, and time.  Psychiatric:        Behavior: Behavior normal.        Thought Content: Thought content normal.     ASSESSMENT/PLAN:  1. Iron deficiency anemia, unspecified iron deficiency anemia type Iron rich foods list given.  - Iron, TIBC and Ferritin Panel - CBC with Differential  2. Chronic constipation Discontinue Amitiza.  Start Linzess daily.  Also encourage patient to make a follow-up appointment with gastroenterology.  Continue with Benefiber.  Encouraged to be more consistent with this - linaclotide (LINZESS) 145 MCG CAPS capsule; Take 1 capsule (145 mcg total) by mouth daily before breakfast.  Dispense: 90 capsule; Refill: 1  3. Allergic reaction to drug, subsequent encounter Feraheme added to allergy list.  She will continue with iron rich foods.  Start Zyrtec for eye itching. - cetirizine (ZYRTEC) 10 MG tablet; Take 1 tablet (10 mg total) by mouth daily.  Dispense: 30 tablet; Refill: 11   Language barrier to communication: Interpreter services used throughout this encounter.     The patient was given clear instructions to go to ER or return to medical center if symptoms do not improve, worsen or new problems develop. The patient verbalized understanding and agreed with plan of care.   [name]. Nathaneil Canary, FNP-BC Patient  [facility] [address] [place], [place] [address] [phone]     This note has been created with Dragon speech recognition software and smart phrase technology. Any transcriptional errors are unintentional.

## 2018-11-02 LAB — CBC WITH DIFFERENTIAL/PLATELET
Basophils Absolute: 0 10*3/uL (ref 0.0–0.2)
Basos: 1 %
EOS (ABSOLUTE): 0.1 10*3/uL (ref 0.0–0.4)
Eos: 1 %
Hematocrit: 38.5 % (ref 34.0–46.6)
Hemoglobin: 12.7 g/dL (ref 11.1–15.9)
Immature Grans (Abs): 0 10*3/uL (ref 0.0–0.1)
Immature Granulocytes: 1 %
Lymphocytes Absolute: 2 10*3/uL (ref 0.7–3.1)
Lymphs: 34 %
MCH: 27.8 pg (ref 26.6–33.0)
MCHC: 33 g/dL (ref 31.5–35.7)
MCV: 84 fL (ref 79–97)
Monocytes Absolute: 0.4 10*3/uL (ref 0.1–0.9)
Monocytes: 7 %
Neutrophils Absolute: 3.3 10*3/uL (ref 1.4–7.0)
Neutrophils: 56 %
Platelets: 241 10*3/uL (ref 150–450)
RBC: 4.57 x10E6/uL (ref 3.77–5.28)
RDW: 12.1 % (ref 11.7–15.4)
WBC: 5.8 10*3/uL (ref 3.4–10.8)

## 2018-11-02 LAB — IRON,TIBC AND FERRITIN PANEL
Ferritin: 446 ng/mL — ABNORMAL HIGH (ref 15–150)
Iron Saturation: >95 % (ref 15–55)
Iron: 360 ug/dL (ref 27–159)
Total Iron Binding Capacity: <377 ug/dL (ref 250–450)
UIBC: <17 ug/dL — ABNORMAL LOW (ref 131–425)

## 2018-11-02 NOTE — Addendum Note (Signed)
Addended by: Genelle Bal on: 11/02/2018 04:55 PM   Modules accepted: Orders

## 2018-11-05 ENCOUNTER — Telehealth: Payer: Self-pay

## 2018-11-05 NOTE — Telephone Encounter (Signed)
Called Using [employer] [phone]. No answer. Message was left for patient to call back regarding labs. Thanks!

## 2018-11-05 NOTE — Telephone Encounter (Signed)
-----   Message from Lanae Boast, [place] sent at 11/02/2018  5:05 PM EST ----- Please inform patient that her iron levels are too high. Would like for her to be referred to hematology for further evaluation. If she develops chest pain, SOB, or unexplained fatigue, she can go to the ED for further evaluation.

## 2018-11-07 ENCOUNTER — Encounter: Payer: Self-pay | Admitting: Hematology

## 2018-11-07 ENCOUNTER — Telehealth: Payer: Self-pay | Admitting: Hematology

## 2018-11-07 NOTE — Telephone Encounter (Signed)
A new hem appt has been scheduled for the pt to see Dr. Irene Limbo on 3/4 at 1pm. I cld the language to provide the appt date and time. The interpreter left the appt date and time on the pt's vm. I notified the referring office to reiterate the appt to the pt as well. Letter mailed.

## 2018-11-09 DIAGNOSIS — Z3009 Encounter for other general counseling and advice on contraception: Secondary | ICD-10-CM | POA: Diagnosis not present

## 2018-11-09 DIAGNOSIS — E669 Obesity, unspecified: Secondary | ICD-10-CM | POA: Diagnosis not present

## 2018-11-09 DIAGNOSIS — Z1388 Encounter for screening for disorder due to exposure to contaminants: Secondary | ICD-10-CM | POA: Diagnosis not present

## 2018-11-09 DIAGNOSIS — Z0389 Encounter for observation for other suspected diseases and conditions ruled out: Secondary | ICD-10-CM | POA: Diagnosis not present

## 2018-11-09 DIAGNOSIS — D259 Leiomyoma of uterus, unspecified: Secondary | ICD-10-CM | POA: Diagnosis not present

## 2018-11-09 DIAGNOSIS — Z01419 Encounter for gynecological examination (general) (routine) without abnormal findings: Secondary | ICD-10-CM | POA: Diagnosis not present

## 2018-11-09 DIAGNOSIS — O926 Galactorrhea: Secondary | ICD-10-CM | POA: Diagnosis not present

## 2018-11-14 ENCOUNTER — Telehealth: Payer: Self-pay | Admitting: Hematology

## 2018-11-14 ENCOUNTER — Inpatient Hospital Stay: Payer: Medicaid Other | Attending: Hematology | Admitting: Hematology

## 2018-11-14 VITALS — BP 108/76 | HR 84 | Temp 97.8°F | Resp 18 | Ht 65.0 in | Wt 193.9 lb

## 2018-11-14 DIAGNOSIS — K219 Gastro-esophageal reflux disease without esophagitis: Secondary | ICD-10-CM | POA: Insufficient documentation

## 2018-11-14 DIAGNOSIS — N920 Excessive and frequent menstruation with regular cycle: Secondary | ICD-10-CM | POA: Insufficient documentation

## 2018-11-14 DIAGNOSIS — D500 Iron deficiency anemia secondary to blood loss (chronic): Secondary | ICD-10-CM

## 2018-11-14 DIAGNOSIS — E611 Iron deficiency: Secondary | ICD-10-CM | POA: Diagnosis not present

## 2018-11-14 DIAGNOSIS — R7989 Other specified abnormal findings of blood chemistry: Secondary | ICD-10-CM | POA: Diagnosis not present

## 2018-11-14 DIAGNOSIS — Z791 Long term (current) use of non-steroidal anti-inflammatories (NSAID): Secondary | ICD-10-CM | POA: Diagnosis not present

## 2018-11-14 DIAGNOSIS — Z79899 Other long term (current) drug therapy: Secondary | ICD-10-CM | POA: Diagnosis not present

## 2018-11-14 NOTE — Telephone Encounter (Signed)
Per 3/4 los RTC with Dr Irene Limbo as needed if any new questions arise

## 2018-11-14 NOTE — Progress Notes (Signed)
HEMATOLOGY/ONCOLOGY CONSULTATION NOTE  Date of Service: 11/14/2018  Patient Care Team: Lanae Boast, FNP as PCP - General (Family Medicine)  CHIEF COMPLAINTS/PURPOSE OF CONSULTATION:  Elevated Ferritin  HISTORY OF PRESENTING ILLNESS:   Kirsten Torres is a wonderful 48 y.o. female who has been referred to [place] by Lanae Boast, FNP for evaluation and management of Elevated Ferritin. She is accompanied today by her husband who is translating for the pt. The pt reports that she is doing well overall.  The pt reports that she has not know of having low iron prior to this month. Her Ferritin was seen to be 14 on 10/19/18, received an IV Feraheme infusion on 10/29/18, and then had repeat labs 3 days later. The pt notes that she had an allergic reaction to IV Feraheme. The pt developed itchy eyes, swollen face, some difficulty breathing and some abdominal pains, and denies skin rashes. The pt notes that she finished the whole bag of IV Feraheme.  The pt notes that her periods last about 6 days, with 3-4 days being heavy. The pt notes that her periods present every 3 weeks as well. She [facility], but notes that her OBGYN is not planning to address this.  The pt notes that she consumes some kinds of meat, and not others, and denies other dietary restrictions.   The pt notes that she has begun having improved energy levels since having her IV Iron infusion. She denies abdominal pains.    Most recent lab results (10/2018) of CBC w/diff is as follows: all values are WNL. 11/01/18 TIBC at <377, UIBC <17, Iron at 360, Iron sat at 95%, Ferritin at 446 10/19/18 Ferritin at 14  On review of systems, pt reports heavy periods, improved energy levels, and denies abdominal pain, leg swelling, and any other symptoms.   On PMHx the pt reports GERD   MEDICAL HISTORY:  Past Medical History:  Diagnosis Date  . Genital mutilation, female   . GERD (gastroesophageal  reflux disease)   . SVD (spontaneous vaginal delivery) 05/28/2011    SURGICAL HISTORY: Past Surgical History:  Procedure Laterality Date  . genital mutilation    . TONSILLECTOMY  2004    SOCIAL HISTORY: Social History   Socioeconomic History  . Marital status: Married    Spouse name: Not on file  . Number of children: 2  . Years of education: master's  . Highest education level: Not on file  Occupational History  . Occupation: homemaker  Social Needs  . Financial resource strain: Not on file  . Food insecurity:    Worry: Not on file    Inability: Not on file  . Transportation needs:    Medical: Not on file    Non-medical: Not on file  Tobacco Use  . Smoking status: Never Smoker  . Smokeless tobacco: Never Used  Substance and Sexual Activity  . Alcohol use: No  . Drug use: No  . Sexual activity: Yes    Partners: Male    Birth control/protection: None    Comment: stopped COC to become pregnant  Lifestyle  . Physical activity:    Days per week: Not on file    Minutes per session: Not on file  . Stress: Not on file  Relationships  . Social connections:    Talks on phone: Not on file    Gets together: Not on file    Attends religious service: Not on file  Active member of club or organization: Not on file    Attends meetings of clubs or organizations: Not on file    Relationship status: Not on file  . Intimate partner violence:    Fear of current or ex partner: Not on file    Emotionally abused: Not on file    Physically abused: Not on file    Forced sexual activity: Not on file  Other Topics Concern  . Not on file  Social History Narrative   From [place]. Came to the [place] in 2011. Master's Degree in [employer].   Lives with her husband (came here from [place] in 2004) and their 2 children.    FAMILY HISTORY: Family History  Problem Relation Age of Onset  . Colon cancer Neg Hx   . Colon polyps Neg Hx     ALLERGIES:  is allergic to iron.  MEDICATIONS:    Current Outpatient Medications  Medication Sig Dispense Refill  . AMBULATORY NON FORMULARY MEDICATION Medication Name: Nitroglycerin 0.125 % ointment , apply pea size amount to rectum three times a day x 2-3 months (Patient not taking: Reported on 11/01/2018) 30 g 1  . calcium carbonate (TUMS - DOSED IN MG ELEMENTAL CALCIUM) 500 MG chewable tablet Chew 1 tablet by mouth daily.    . cetirizine (ZYRTEC) 10 MG tablet Take 1 tablet (10 mg total) by mouth daily. 30 tablet 11  . cyclobenzaprine (FLEXERIL) 5 MG tablet Take 1 tablet (5 mg total) by mouth at bedtime as needed for muscle spasms. 30 tablet 0  . hydrocortisone (ANUSOL-HC) 2.5 % rectal cream Place 1 application rectally at bedtime. 28.35 g 1  . linaclotide (LINZESS) 145 MCG CAPS capsule Take 1 capsule (145 mcg total) by mouth daily before breakfast. 90 capsule 1  . lubiprostone (AMITIZA) 24 MCG capsule Take 1 capsule (24 mcg total) by mouth 2 (two) times daily with a meal. 60 capsule 2  . meloxicam (MOBIC) 15 MG tablet Take 1 tablet (15 mg total) by mouth daily as needed for pain. 30 tablet 1  . Naproxen Sodium (ALEVE PO) Take by mouth.    Marland Kitchen omeprazole (PRILOSEC) 20 MG capsule Take 1 capsule (20 mg total) by mouth daily. 30 capsule 1  . Vitamin D, Ergocalciferol, (DRISDOL) 1.25 MG (50000 UT) CAPS capsule Take 1 capsule (50,000 Units total) by mouth every 7 (seven) days. 8 capsule 0   Current Facility-Administered Medications  Medication Dose Route Frequency Provider Last Rate Last Dose  . 0.9 %  sodium chloride infusion  500 mL Intravenous Once Nandigam, Venia Minks, MD        REVIEW OF SYSTEMS:    10 Point review of Systems was done is negative except as noted above.  PHYSICAL EXAMINATION:  . Vitals:   11/14/18 1317  BP: 108/76  Pulse: 84  Resp: 18  Temp: 97.8 F (36.6 C)  SpO2: 100%   Filed Weights   11/14/18 1317  Weight: 193 lb 14.4 oz (88 kg)   .Body mass index is 32.27 kg/m.  GENERAL:alert, in no acute distress and  comfortable SKIN: no acute rashes, no significant lesions EYES: conjunctiva are pink and non-injected, sclera anicteric OROPHARYNX: MMM, no exudates, no oropharyngeal erythema or ulceration NECK: supple, no JVD LYMPH:  no palpable lymphadenopathy in the cervical, axillary or inguinal regions LUNGS: clear to auscultation b/l with normal respiratory effort HEART: regular rate & rhythm ABDOMEN:  normoactive bowel sounds , non tender, not distended. Extremity: no pedal edema PSYCH: alert &  oriented x 3 with fluent speech NEURO: no focal motor/sensory deficits  LABORATORY DATA:  I have reviewed the data as listed  . CBC Latest Ref Rng & Units 11/01/2018 10/19/2018 01/19/2017  WBC 3.4 - 10.8 x10E3/uL 5.8 5.2 5.3  Hemoglobin 11.1 - 15.9 g/dL 12.7 12.5 12.7  Hematocrit 34.0 - 46.6 % 38.5 37.1 39.3  Platelets 150 - 450 x10E3/uL 241 238 255    . CMP Latest Ref Rng & Units 10/19/2018 01/19/2017 06/21/2016  Glucose 65 - 99 mg/dL 95 84 84  BUN 6 - 24 mg/dL 9 11 9   Creatinine 0.57 - 1.00 mg/dL 0.73 0.69 0.62  Sodium 134 - 144 mmol/L 138 138 138  Potassium 3.5 - 5.2 mmol/L 3.9 3.7 4.0  Chloride 96 - 106 mmol/L 101 103 103  CO2 20 - 29 mmol/L 22 24 28   Calcium 8.7 - 10.2 mg/dL 9.6 9.2 8.9  Total Protein 6.0 - 8.5 g/dL 6.8 6.9 6.5  Total Bilirubin 0.0 - 1.2 mg/dL 0.3 0.4 0.3  Alkaline Phos 39 - 117 IU/L 64 43 50  AST 0 - 40 IU/L 11 13 10   ALT 0 - 32 IU/L 6 6 5(L)     RADIOGRAPHIC STUDIES: I have personally reviewed the radiological images as listed and agreed with the findings in the report. No results found.  ASSESSMENT & PLAN:   48 y.o. female with  1. Iron deficiency with Elevated Ferritin due to IV Iron. PLAN -Discussed patient's most recent labs from 11/01/18, blood counts are normal, Ferritin high at 446 in setting of recent IV Feraheme as expected. -Discussed that the patient's heavy menstrual blood loss is the most likely explanation for her iron deficiency -Recommend rechecking  iron levels 6-8 weeks after IV Iron infusion with PCP -Recommend PCP consider OBGYN referral to address menorrhagia as cause of iron deficiency -Recommend pt take PO 150mg  Iron Polysaccharide, which causes less constipation, if she needs further iron replacement for a goal of Ferritin between 50 and 100 in setting of menorrhagia. -If pt needs to receive IV Iron in the future, would recommend pre-medications beforehand and switching from Feraheme to [facility] -Recommend daily multivitamin with iron for maintenance as well -Will see the pt back as needed   RTC with Dr Irene Limbo as needed if any new questions arise.   All of the patients questions were answered with apparent satisfaction. The patient knows to call the clinic with any problems, questions or concerns.  The total time spent in the appt was 30 minutes and more than 50% was on counseling and direct patient cares.    Sullivan Lone MD MS AAHIVMS [facility] Hematology/Oncology Physician [facility]  (Office):       [phone] (Work cell):  [phone] (Fax):           [phone]  11/14/2018 1:55 PM  I, Baldwin [place], am acting as a scribe for Dr. Sullivan Lone.   .I have reviewed the above documentation for accuracy and completeness, and I agree with the above. Brunetta Genera MD

## 2018-11-15 ENCOUNTER — Encounter: Payer: Self-pay | Admitting: Family Medicine

## 2018-11-15 ENCOUNTER — Ambulatory Visit (INDEPENDENT_AMBULATORY_CARE_PROVIDER_SITE_OTHER): Payer: Medicaid Other | Admitting: Family Medicine

## 2018-11-15 VITALS — BP 112/60 | HR 89 | Temp 98.0°F | Resp 16 | Wt 194.2 lb

## 2018-11-15 DIAGNOSIS — D509 Iron deficiency anemia, unspecified: Secondary | ICD-10-CM

## 2018-11-15 DIAGNOSIS — N920 Excessive and frequent menstruation with regular cycle: Secondary | ICD-10-CM | POA: Diagnosis not present

## 2018-11-15 NOTE — Patient Instructions (Signed)
Anemia  Anemia is a condition in which you do not have enough red blood cells or hemoglobin. Hemoglobin is a substance in red blood cells that carries oxygen. When you do not have enough red blood cells or hemoglobin (are anemic), your body cannot get enough oxygen and your organs may not work properly. As a result, you may feel very tired or have other problems. What are the causes? Common causes of anemia include:  Excessive bleeding. Anemia can be caused by excessive bleeding inside or outside the body, including bleeding from the intestine or from periods in women.  Poor nutrition.  Long-lasting (chronic) kidney, thyroid, and liver disease.  Bone marrow disorders.  Cancer and treatments for cancer.  HIV (human immunodeficiency virus) and AIDS (acquired immunodeficiency syndrome).  Treatments for HIV and AIDS.  Spleen problems.  Blood disorders.  Infections, medicines, and autoimmune disorders that destroy red blood cells. What are the signs or symptoms? Symptoms of this condition include:  Minor weakness.  Dizziness.  Headache.  Feeling heartbeats that are irregular or faster than normal (palpitations).  Shortness of breath, especially with exercise.  Paleness.  Cold sensitivity.  Indigestion.  Nausea.  Difficulty sleeping.  Difficulty concentrating. Symptoms may occur suddenly or develop slowly. If your anemia is mild, you may not have symptoms. How is this diagnosed? This condition is diagnosed based on:  Blood tests.  Your medical history.  A physical exam.  Bone marrow biopsy. Your health care provider may also check your stool (feces) for blood and may do additional testing to look for the cause of your bleeding. You may also have other tests, including:  Imaging tests, such as a CT scan or MRI.  Endoscopy.  Colonoscopy. How is this treated? Treatment for this condition depends on the cause. If you continue to lose a lot of blood, you may  need to be treated at a hospital. Treatment may include:  Taking supplements of iron, vitamin S31, or folic acid.  Taking a hormone medicine (erythropoietin) that can help to stimulate red blood cell growth.  Having a blood transfusion. This may be needed if you lose a lot of blood.  Making changes to your diet.  Having surgery to remove your spleen. Follow these instructions at home:  Take over-the-counter and prescription medicines only as told by your health care provider.  Take supplements only as told by your health care provider.  Follow any diet instructions that you were given.  Keep all follow-up visits as told by your health care provider. This is important. Contact a health care provider if:  You develop new bleeding anywhere in the body. Get help right away if:  You are very weak.  You are short of breath.  You have pain in your abdomen or chest.  You are dizzy or feel faint.  You have trouble concentrating.  You have bloody or black, tarry stools.  You vomit repeatedly or you vomit up blood. Summary  Anemia is a condition in which you do not have enough red blood cells or enough of a substance in your red blood cells that carries oxygen (hemoglobin).  Symptoms may occur suddenly or develop slowly.  If your anemia is mild, you may not have symptoms.  This condition is diagnosed with blood tests as well as a medical history and physical exam. Other tests may be needed.  Treatment for this condition depends on the cause of the anemia. This information is not intended to replace advice given to you by  your health care provider. Make sure you discuss any questions you have with your health care provider. Document Released: 10/06/2004 Document Revised: 09/30/2016 Document Reviewed: 09/30/2016 Elsevier Interactive Patient Education  2019 [employer].

## 2018-11-15 NOTE — Progress Notes (Signed)
Patient [facility]   Progress Note: General Provider: Lanae Boast, FNP  SUBJECTIVE:   Kirsten Torres is a 48 y.o. female who  has a past medical history of Genital mutilation, female, GERD (gastroesophageal reflux disease), and SVD (spontaneous vaginal delivery) (05/28/2011).. Patient presents today for Follow-up (no complaints)   Patient is accompanied by her husband who has served as her interpretor in the past. Today, the interpretor services was utilized. Patient presents for follow up on low iron. She was seen by the hematologist, Dr. Joylene Draft,  yesterday and reported that she never had issues with low iron or anemia prior to her last appt with this provider. Patient previously reported a history of anemia and the inability to take iron due to her chronic constipation. Patient reports a history of heavy menstrual bleeding with her periods lasting 6 days. She states that she [facility] her [facility] issues. She would like a referral to GYN for further evaluation of this. Has not scheduled gastroenterology follow up. Today, patient states that she is doing well and without problems.   Review of Systems  Constitutional: Negative.   HENT: Negative.   Eyes: Negative.   Respiratory: Negative.   Cardiovascular: Negative.   Gastrointestinal: Negative.   Genitourinary: Negative.   Musculoskeletal: Negative.   Skin: Negative.   Neurological: Negative.   Psychiatric/Behavioral: Negative.      OBJECTIVE: BP 112/60 (BP Location: Right Arm, Patient Position: Sitting, Cuff Size: Normal)   Pulse 89   Temp 98 F (36.7 C) (Oral)   Resp 16   Wt 194 lb 3.2 oz (88.1 kg)   LMP 10/20/2018   SpO2 98%   BMI 32.32 kg/m   Wt Readings from Last 3 Encounters:  11/15/18 194 lb 3.2 oz (88.1 kg)  11/14/18 193 lb 14.4 oz (88 kg)  11/01/18 194 lb (88 kg)     Physical Exam Vitals signs and nursing note reviewed.    Constitutional:      General: She is not in acute distress.    Appearance: She is well-developed.  HENT:     Head: Normocephalic and atraumatic.  Eyes:     Conjunctiva/sclera: Conjunctivae normal.     Pupils: Pupils are equal, round, and reactive to light.  Neck:     Musculoskeletal: Normal range of motion.  Cardiovascular:     Rate and Rhythm: Normal rate and regular rhythm.     Heart sounds: Normal heart sounds.  Pulmonary:     Effort: Pulmonary effort is normal. No respiratory distress.     Breath sounds: Normal breath sounds.  Musculoskeletal: Normal range of motion.  Skin:    General: Skin is warm and dry.  Neurological:     Mental Status: She is alert and oriented to person, place, and time.  Psychiatric:        Mood and Affect: Mood normal.        Behavior: Behavior normal.        Thought Content: Thought content normal.        Judgment: Judgment normal.     ASSESSMENT/PLAN:  1. Menorrhagia with regular cycle Referred for further evaluation - Ambulatory referral to Gynecology  2. Iron deficiency anemia, unspecified iron deficiency anemia type We will continue to monitor. Next labs in 3 months. Feraheme added as an allergy for patient. Dr. Joylene Draft suggests pre-medicating patient if used in the future and switching to [facility]  Return in about  3 months (around 02/15/2019) for Iron deficient anemia.    The patient was given clear instructions to go to ER or return to medical center if symptoms do not improve, worsen or new problems develop. The patient verbalized understanding and agreed with plan of care.   [name]. Nathaneil Canary, FNP-BC Patient [facility] [address] [place], [place] [address] [phone]

## 2018-12-03 MED FILL — VIT D2 1.25 MG (50,000 UNIT: 1.25 MG | 28 days supply | Qty: 4 | Fill #1

## 2019-01-10 MED FILL — LINZESS 145 MCG CAPSULE: 145 | 30 days supply | Qty: 30 | Fill #1

## 2019-02-06 MED FILL — LINZESS 145 MCG CAPSULE: 145 | 30 days supply | Qty: 30 | Fill #1

## 2019-02-07 ENCOUNTER — Ambulatory Visit: Payer: Medicaid Other | Admitting: Family Medicine

## 2019-03-21 ENCOUNTER — Ambulatory Visit (INDEPENDENT_AMBULATORY_CARE_PROVIDER_SITE_OTHER): Payer: Medicaid Other | Admitting: Obstetrics & Gynecology

## 2019-03-21 ENCOUNTER — Other Ambulatory Visit: Payer: Self-pay

## 2019-03-21 ENCOUNTER — Encounter: Payer: Self-pay | Admitting: Obstetrics & Gynecology

## 2019-03-21 ENCOUNTER — Telehealth: Payer: Self-pay | Admitting: Obstetrics & Gynecology

## 2019-03-21 ENCOUNTER — Ambulatory Visit: Payer: Medicaid Other | Admitting: Family Medicine

## 2019-03-21 DIAGNOSIS — N920 Excessive and frequent menstruation with regular cycle: Secondary | ICD-10-CM

## 2019-03-21 DIAGNOSIS — Z1239 Encounter for other screening for malignant neoplasm of breast: Secondary | ICD-10-CM | POA: Diagnosis not present

## 2019-03-21 MED ORDER — MEGESTROL ACETATE 40 MG PO TABS: 40.0000 mg | ORAL_TABLET | Freq: Two times a day (BID) | ORAL | 2 refills | Status: DC

## 2019-03-21 MED FILL — MEGESTROL 40 MG TABLET: 40 | 15 days supply | Qty: 60 | Fill #0

## 2019-03-21 NOTE — Progress Notes (Signed)
   TELEHEALTH VIRTUAL GYNECOLOGY VISIT ENCOUNTER NOTE  I connected with Kirsten Torres on 03/21/19 at  3:35 PM EDT by telephone at home and verified that I am speaking with the correct person using two identifiers.   I discussed the limitations, risks, security and privacy concerns of performing an evaluation and management service by telephone and the availability of in person appointments. I also discussed with the patient that there may be a patient responsible charge related to this service. The patient expressed understanding and agreed to proceed.   History:  Kirsten Torres is a 48 y.o. G26P2002 female being evaluated today for heavy bleeding.LMP 03/09/2019.  Pt reports that she does not know why she is scheduled. When asked abut her heavy cycles she reports that this problem has resolved. She reports menses every 21-22 days and lasts 6 days. 3 of the days are heavy.   She denies any abnormal vaginal discharge, bleeding, pelvic pain or other concerns.  Pt denies bleeding between cycles.      Past Medical History:  Diagnosis Date  . Genital mutilation, female   . GERD (gastroesophageal reflux disease)   . SVD (spontaneous vaginal delivery) 05/28/2011   Past Surgical History:  Procedure Laterality Date  . genital mutilation    . TONSILLECTOMY  2004   The following portions of the patient's history were reviewed and updated as appropriate: allergies, current medications, past family history, past medical history, past social history, past surgical history and problem list.   Health Maintenance:  Normal pap on 06/02/2017.  Normal mammogram on *12/07/2015.   Review of Systems:  Pertinent items noted in HPI and remainder of comprehensive ROS otherwise negative.  Physical Exam:   General:  Alert, oriented and cooperative.   Mental Status: Normal mood and affect perceived. Normal judgment and thought content.  Physical exam deferred due to nature of the encounter  Labs and Imaging  CBC Latest Ref Rng & Units 11/01/2018 10/19/2018 01/19/2017  WBC 3.4 - 10.8 x10E3/uL 5.8 5.2 5.3  Hemoglobin 11.1 - 15.9 g/dL 12.7 12.5 12.7  Hematocrit 34.0 - 46.6 % 38.5 37.1 39.3  Platelets 150 - 450 x10E3/uL 241 238 255   Assessment and Plan:     Menorrhagia with reg cycles.   Megace 40mg  bid   F/u in 2 months   PAP due 05/2020  Screen breast cancer  Screening mammogram      I discussed the assessment and treatment plan with the patient. The patient was provided an opportunity to ask questions and all were answered. The patient agreed with the plan and demonstrated an understanding of the instructions.   The patient was advised to call back or seek an in-person evaluation/go to the ED if the symptoms worsen or if the condition fails to improve as anticipated.  I provided  20 minutes of non-face-to-face time during this encounter.  An Arabic interpreter was used for the entire visit. Pts husband was also on the phone.  Lavonia Drafts, MD [facility], [place]

## 2019-03-21 NOTE — Progress Notes (Signed)
I connected with  [name] on 03/21/19 at  3:35 PM EDT by telephone and verified that I am speaking with the correct person using two identifiers.   I discussed the limitations, risks, security and privacy concerns of performing an evaluation and management service by telephone and the availability of in person appointments. I also discussed with the patient that there may be a patient responsible charge related to this service. The patient expressed understanding and agreed to proceed.  Alric Seton, [place] 03/21/2019  3:38 PM   [facility] [place] [phone]

## 2019-03-21 NOTE — Telephone Encounter (Signed)
Attempted to call patient about her appointment on 7/9 @ 3:35. No answer, left voicemail instructing patient that is it a [employer] account planner appointment. Patient instructed to download mychart app if not already done. Patient instructed to give the office a call if needing assistance.

## 2019-04-17 MED FILL — LINZESS 145 MCG CAPSULE: 145 | 30 days supply | Qty: 30 | Fill #2

## 2019-04-17 MED FILL — OMEPRAZOLE 20 MG CAP: 20 | 30 days supply | Qty: 30 | Fill #1

## 2019-05-07 ENCOUNTER — Inpatient Hospital Stay: Admission: RE | Admit: 2019-05-07 | Payer: Medicaid Other | Source: Ambulatory Visit

## 2019-05-15 ENCOUNTER — Telehealth: Payer: Self-pay | Admitting: Obstetrics & Gynecology

## 2019-05-15 NOTE — Telephone Encounter (Signed)
Attempted to contact patient w/ Arabic interpreter ID# [phone] about her appointment on 9/3 @ 9:15. No answer, interpreter left voicemail instructing patient that the appointment is a virtual visit. Patient instructed to download the [facility] app if not already done so. Patient instructed to give the office a call with any concerns.

## 2019-05-16 ENCOUNTER — Encounter: Payer: Medicaid Other | Admitting: Obstetrics & Gynecology

## 2019-05-16 ENCOUNTER — Other Ambulatory Visit: Payer: Self-pay

## 2019-05-16 NOTE — Progress Notes (Signed)
Pt not seen. Was not available for visit.

## 2019-05-16 NOTE — Progress Notes (Signed)
Called pt @ 0915 and LM with [place] # [phone] that I am calling in regards to her appt.  I will call in 15 min in which if I do not reach her I will have to request that she reschedules.    Called pt with [place] # [phone] and LM that this is our second attempt to reach her that I am going to have to request that she calls the office to reschedule her appt.    Mel Almond, RN 05/16/19

## 2019-05-21 ENCOUNTER — Telehealth: Payer: Self-pay

## 2019-05-21 NOTE — Telephone Encounter (Signed)
Called, no answer. Left a message to call back.  

## 2019-05-22 ENCOUNTER — Encounter (HOSPITAL_COMMUNITY): Payer: Self-pay

## 2019-05-22 ENCOUNTER — Encounter (HOSPITAL_COMMUNITY): Payer: Self-pay | Admitting: *Deleted

## 2019-05-24 ENCOUNTER — Ambulatory Visit: Payer: Medicaid Other | Admitting: Obstetrics & Gynecology

## 2019-06-05 ENCOUNTER — Telehealth: Payer: Self-pay | Admitting: Obstetrics & Gynecology

## 2019-06-05 NOTE — Telephone Encounter (Signed)
Attempted to contact patient w/ Arabic interpreter ID# [phone] about her appointment on 9/24 @ 8:55. No answer, interpreter left voicemail instructing patient that the appointment is a virtual visit. Patient instructed to download the [facility] app if not already done so. Patient instructed to give the office a call with any concerns.

## 2019-06-06 ENCOUNTER — Other Ambulatory Visit: Payer: Self-pay

## 2019-06-06 ENCOUNTER — Encounter: Payer: Self-pay | Admitting: Obstetrics & Gynecology

## 2019-06-06 ENCOUNTER — Telehealth (INDEPENDENT_AMBULATORY_CARE_PROVIDER_SITE_OTHER): Payer: Medicaid Other | Admitting: Obstetrics & Gynecology

## 2019-06-06 DIAGNOSIS — N920 Excessive and frequent menstruation with regular cycle: Secondary | ICD-10-CM | POA: Diagnosis not present

## 2019-06-06 MED ORDER — MEGESTROL ACETATE 40 MG PO TABS: 40.0000 mg | ORAL_TABLET | Freq: Two times a day (BID) | ORAL | 3 refills | Status: DC

## 2019-06-06 NOTE — Progress Notes (Signed)
    TELEHEALTH GYNECOLOGY VIRTUAL VIDEO VISIT ENCOUNTER NOTE  Provider location: [facility] at [place]   I connected with Kirsten Torres on 06/06/19 at  8:55 AM EDT by telephone Encounter at home and verified that I am speaking with the correct person using two identifiers. Pts husband and the Arabic interpreter were used for the call.    I discussed the limitations, risks, security and privacy concerns of performing an evaluation and management service virtually and the availability of in person appointments. I also discussed with the patient that there may be a patient responsible charge related to this service. The patient expressed understanding and agreed to proceed.   History:  Kirsten Torres is a 48 y.o. G17P2002 female being evaluated today for menorrhagia. [name] is on Megace and the bleeding is lighter. Her cycles was absent for 45 days followed by a 2 week cycle. But, pt reports overall better satisfaction with her cycles.  She denies any abnormal vaginal discharge, bleeding, pelvic pain or other concerns.       Past Medical History:  Diagnosis Date  . Genital mutilation, female   . GERD (gastroesophageal reflux disease)   . SVD (spontaneous vaginal delivery) 05/28/2011   Past Surgical History:  Procedure Laterality Date  . genital mutilation    . TONSILLECTOMY  2004   The following portions of the patient's history were reviewed and updated as appropriate: allergies, current medications, past family history, past medical history, past social history, past surgical history and problem list.     Review of Systems:  Pertinent items noted in HPI and remainder of comprehensive ROS otherwise negative.  Physical Exam:   General:  Alert, oriented and cooperative. Patient appears to be in no acute distress.  Mental Status: Normal mood and affect. Normal behavior. Normal judgment and thought content.   Respiratory: Normal respiratory effort, no problems with  respiration noted  Rest of physical exam deferred due to type of encounter    Assessment and Plan:     AUB   Needs pelvic US  Pt requests in person visit  Keep Megace 40mg  bid.       I discussed the assessment and treatment plan with the patient. The patient was provided an opportunity to ask questions and all were answered. The patient agreed with the plan and demonstrated an understanding of the instructions.   The patient was advised to call back or seek an in-person evaluation/go to the ED if the symptoms worsen or if the condition fails to improve as anticipated.  I provided 17 minutes of face-to-face time during this encounter.   Lavonia Drafts, MD [facility], [place]

## 2019-06-12 ENCOUNTER — Encounter

## 2019-06-20 ENCOUNTER — Ambulatory Visit (HOSPITAL_COMMUNITY): Payer: Medicaid Other | Attending: Obstetrics & Gynecology

## 2019-06-26 MED FILL — LINZESS 145 MCG CAPSULE: 145 | 30 days supply | Qty: 30 | Fill #3

## 2019-06-26 MED FILL — OMEPRAZOLE 20 MG CAP: 20 | 30 days supply | Qty: 30 | Fill #1

## 2019-06-28 ENCOUNTER — Encounter: Payer: Self-pay | Admitting: *Deleted

## 2019-07-05 ENCOUNTER — Encounter: Payer: Self-pay | Admitting: Obstetrics & Gynecology

## 2019-07-05 ENCOUNTER — Other Ambulatory Visit (HOSPITAL_COMMUNITY)
Admission: RE | Admit: 2019-07-05 | Discharge: 2019-07-05 | Disposition: A | Discharge status: Home / Self Care | Payer: Medicaid Other | Source: Ambulatory Visit | Attending: Obstetrics & Gynecology | Admitting: Obstetrics & Gynecology

## 2019-07-05 ENCOUNTER — Other Ambulatory Visit: Payer: Self-pay

## 2019-07-05 ENCOUNTER — Ambulatory Visit (INDEPENDENT_AMBULATORY_CARE_PROVIDER_SITE_OTHER): Payer: Medicaid Other | Admitting: Obstetrics & Gynecology

## 2019-07-05 VITALS — BP 113/81 | HR 88 | Ht 65.0 in | Wt 188.0 lb

## 2019-07-05 DIAGNOSIS — N841 Polyp of cervix uteri: Secondary | ICD-10-CM | POA: Insufficient documentation

## 2019-07-05 DIAGNOSIS — N938 Other specified abnormal uterine and vaginal bleeding: Secondary | ICD-10-CM | POA: Diagnosis not present

## 2019-07-05 DIAGNOSIS — Z789 Other specified health status: Secondary | ICD-10-CM

## 2019-07-05 NOTE — Progress Notes (Signed)
   Subjective:    Patient ID: Kirsten Torres, female    DOB: Jun 25, 1971, 48 y.o.   MRN: [id number]  HPI 48 yo married P2 (88 and 78 yo kids) here today with concerns about her periods, says "terrible bleeding". Periods are irregular and very long, anywhere from 7 days to 3 weeks. She also has lower pelvic/abdomen pain for several months, "after they gave me the medicine" (megace given 8/20). She is taking megace BID and the bleeding is lighter, but stil lasting 3 weeks at times. She did not get the u/s that was ordered 9/20.  She does not use contraception, wants a pregnancy but is not taking a MVI.   Review of Systems Pap normal in 2018 Married for 14 years    Objective:   Physical Exam Breathing, conversing, and ambulating normally Well nourished, well hydrated Black female, no apparent distress Live Arabic interpretor present Abd- benign Spec exam- large cervical polyp, I prepped this with betadine and used a ring forcep to twist it off. She tolerated the procedure well. Bimanual exam limited by centripital obesity but her uterus feels diffusely enlarged       Assessment & Plan:  DUB- check gyn u/s, cbc, tsh, wet prep Rec MVI daily to help prevent :ONTDs Come back in 3 weeks Polyp sent to pathology

## 2019-07-06 LAB — CBC
Hematocrit: 39.9 % (ref 34.0–46.6)
Hemoglobin: 13.4 g/dL (ref 11.1–15.9)
MCH: 28.8 pg (ref 26.6–33.0)
MCHC: 33.6 g/dL (ref 31.5–35.7)
MCV: 86 fL (ref 79–97)
Platelets: 251 10*3/uL (ref 150–450)
RBC: 4.66 x10E6/uL (ref 3.77–5.28)
RDW: 12.4 % (ref 11.7–15.4)
WBC: 5.9 10*3/uL (ref 3.4–10.8)

## 2019-07-06 LAB — TSH: TSH: 2.17 u[IU]/mL (ref 0.450–4.500)

## 2019-07-08 LAB — SURGICAL PATHOLOGY

## 2019-07-09 LAB — CERVICOVAGINAL ANCILLARY ONLY
Chlamydia: NEGATIVE
Comment: NEGATIVE
Comment: NORMAL
Neisseria Gonorrhea: NEGATIVE

## 2019-07-16 ENCOUNTER — Ambulatory Visit (HOSPITAL_COMMUNITY)
Admission: RE | Admit: 2019-07-16 | Discharge: 2019-07-16 | Disposition: A | Discharge status: Home / Self Care | Payer: Medicaid Other | Source: Ambulatory Visit | Attending: Obstetrics & Gynecology | Admitting: Obstetrics & Gynecology

## 2019-07-16 ENCOUNTER — Other Ambulatory Visit: Payer: Self-pay

## 2019-07-16 DIAGNOSIS — D252 Subserosal leiomyoma of uterus: Secondary | ICD-10-CM | POA: Diagnosis not present

## 2019-07-16 DIAGNOSIS — N920 Excessive and frequent menstruation with regular cycle: Secondary | ICD-10-CM | POA: Insufficient documentation

## 2019-07-30 ENCOUNTER — Telehealth: Payer: Self-pay | Admitting: Obstetrics & Gynecology

## 2019-07-30 NOTE — Telephone Encounter (Signed)
Spoke with patient w/ Arabic interpreter ID# [phone] about her appointment on 11/18 @ 1:35. Patient instructed to wear a face mask for the entire appointment and no visitors are allowed. Patient screened for covid symptoms and denied having any.

## 2019-07-31 ENCOUNTER — Ambulatory Visit: Payer: Medicaid Other | Admitting: Obstetrics & Gynecology

## 2019-08-06 ENCOUNTER — Other Ambulatory Visit: Payer: Self-pay

## 2019-08-06 DIAGNOSIS — N920 Excessive and frequent menstruation with regular cycle: Secondary | ICD-10-CM

## 2019-08-06 MED ORDER — MEGESTROL ACETATE 40 MG PO TABS: 40.0000 mg | ORAL_TABLET | Freq: Two times a day (BID) | ORAL | 3 refills | Status: DC

## 2019-08-06 NOTE — Telephone Encounter (Signed)
Received notification from [facility] on [address] pt requesting to have a refill on Megace 40 mg tablet.  Pt has an appt with Dr. Hulan Fray on 08/26/19.

## 2019-08-26 ENCOUNTER — Ambulatory Visit (INDEPENDENT_AMBULATORY_CARE_PROVIDER_SITE_OTHER): Payer: Medicaid Other | Admitting: Obstetrics & Gynecology

## 2019-08-26 ENCOUNTER — Other Ambulatory Visit: Payer: Self-pay

## 2019-08-26 ENCOUNTER — Encounter: Payer: Self-pay | Admitting: Obstetrics & Gynecology

## 2019-08-26 VITALS — BP 129/78 | HR 85 | Wt 188.0 lb

## 2019-08-26 DIAGNOSIS — Z319 Encounter for procreative management, unspecified: Secondary | ICD-10-CM | POA: Diagnosis not present

## 2019-08-26 DIAGNOSIS — D219 Benign neoplasm of connective and other soft tissue, unspecified: Secondary | ICD-10-CM | POA: Diagnosis not present

## 2019-08-26 NOTE — Progress Notes (Signed)
   Subjective:    Patient ID: Kirsten Torres, female    DOB: [birth year], 48 y.o.   MRN: [id number]  HPI 48 yo married P2 here for review of her u/s. The u/s was ordered for her irregular periods and her chronic pelvic pain. She has an u/s last month that showed 3 relatively small fibroids and a normal endometrium. She wants a pregnancy but has not yet started taking a multivitamin. I reviewed with her the pathology of her benign cerivcal polyp that I removed 07/05/19.  Review of Systems Pap was normal 06/02/17.    Objective:   Physical Exam Breathing, conversing, and ambulating normally Arabic interpretor used via the phone     Assessment & Plan:  Desire for pregnancy- rec start MVI daily,  rec reproductive endocrinology

## 2019-08-27 ENCOUNTER — Ambulatory Visit: Payer: Medicaid Other

## 2019-09-04 ENCOUNTER — Other Ambulatory Visit: Payer: Self-pay | Admitting: Family Medicine

## 2019-09-04 ENCOUNTER — Telehealth: Payer: Self-pay

## 2019-09-04 DIAGNOSIS — K219 Gastro-esophageal reflux disease without esophagitis: Secondary | ICD-10-CM

## 2019-09-04 MED ORDER — OMEPRAZOLE 20 MG PO CPDR: 20.0000 mg | DELAYED_RELEASE_CAPSULE | Freq: Every day | ORAL | 0 refills | Status: DC

## 2019-09-04 MED FILL — LINZESS 145 MCG CAPSULE: 145 | 30 days supply | Qty: 30 | Fill #4

## 2019-09-04 NOTE — Telephone Encounter (Signed)
Pt is needing a refill on Omeprazole. Last ov was with Venora Maples on 11/15/18. Last rx written for Omeprazole was 10/19/18.   If approved please send to [facility]

## 2019-09-04 NOTE — Progress Notes (Signed)
Meds ordered this encounter  Medications  . omeprazole (PRILOSEC) 20 MG capsule    Sig: Take 1 capsule (20 mg total) by mouth daily. Will need to schedule appointment for refills    Dispense:  30 capsule    Refill:  0    Order Specific Question:   Supervising Provider    Answer:   Tresa Garter [id number]    Kendall West, MSN, FNP-C Patient [place] [address] [place], [place] [address] [phone]

## 2019-11-15 ENCOUNTER — Other Ambulatory Visit: Payer: Self-pay | Admitting: Family Medicine

## 2019-11-15 DIAGNOSIS — K5909 Other constipation: Secondary | ICD-10-CM

## 2019-11-15 DIAGNOSIS — K219 Gastro-esophageal reflux disease without esophagitis: Secondary | ICD-10-CM

## 2020-01-03 ENCOUNTER — Ambulatory Visit (INDEPENDENT_AMBULATORY_CARE_PROVIDER_SITE_OTHER): Payer: Medicaid Other | Admitting: Nurse Practitioner

## 2020-01-03 ENCOUNTER — Other Ambulatory Visit: Payer: Self-pay

## 2020-01-03 ENCOUNTER — Encounter: Payer: Self-pay | Admitting: Nurse Practitioner

## 2020-01-03 VITALS — BP 108/85 | HR 90 | Temp 98.2°F | Resp 14 | Ht 65.0 in | Wt 188.0 lb

## 2020-01-03 DIAGNOSIS — Z0000 Encounter for general adult medical examination without abnormal findings: Secondary | ICD-10-CM

## 2020-01-03 DIAGNOSIS — K5909 Other constipation: Secondary | ICD-10-CM | POA: Diagnosis not present

## 2020-01-03 DIAGNOSIS — I781 Nevus, non-neoplastic: Secondary | ICD-10-CM | POA: Diagnosis not present

## 2020-01-03 DIAGNOSIS — K2100 Gastro-esophageal reflux disease with esophagitis, without bleeding: Secondary | ICD-10-CM | POA: Diagnosis not present

## 2020-01-03 DIAGNOSIS — D509 Iron deficiency anemia, unspecified: Secondary | ICD-10-CM

## 2020-01-03 LAB — POCT URINALYSIS DIP (CLINITEK)
Bilirubin, UA: NEGATIVE
Blood, UA: NEGATIVE
Glucose, UA: NEGATIVE mg/dL
Ketones, POC UA: NEGATIVE mg/dL
Leukocytes, UA: NEGATIVE
Nitrite, UA: NEGATIVE
POC PROTEIN,UA: NEGATIVE
Spec Grav, UA: 1.025 (ref 1.010–1.025)
Urobilinogen, UA: 0.2 E.U./dL
pH, UA: 7 (ref 5.0–8.0)

## 2020-01-03 MED ORDER — LINACLOTIDE 145 MCG PO CAPS: 145.0000 ug | ORAL_CAPSULE | Freq: Every day | ORAL | 1 refills | Status: DC

## 2020-01-03 MED ORDER — OMEPRAZOLE 20 MG PO CPDR: 20.0000 mg | DELAYED_RELEASE_CAPSULE | Freq: Every day | ORAL | 1 refills | Status: DC

## 2020-01-03 NOTE — Patient Instructions (Signed)
Constipation, Adult Constipation is when a person:  Poops (has a bowel movement) fewer times in a week than normal.  Has a hard time pooping.  Has poop that is dry, hard, or bigger than normal. Follow these instructions at home: Eating and drinking   Eat foods that have a lot of fiber, such as: ? Fresh fruits and vegetables. ? Whole grains. ? Beans.  Eat less of foods that are high in fat, low in fiber, or overly processed, such as: ? [place] fries. ? Hamburgers. ? Cookies. ? Candy. ? Soda.  Drink enough fluid to keep your pee (urine) clear or pale yellow. General instructions  Exercise regularly or as told by your doctor.  Go to the restroom when you feel like you need to poop. Do not hold it in.  Take over-the-counter and prescription medicines only as told by your doctor. These include any fiber supplements.  Do pelvic floor retraining exercises, such as: ? Doing deep breathing while relaxing your lower belly (abdomen). ? Relaxing your pelvic floor while pooping.  Watch your condition for any changes.  Keep all follow-up visits as told by your doctor. This is important. Contact a doctor if:  You have pain that gets worse.  You have a fever.  You have not pooped for 4 days.  You throw up (vomit).  You are not hungry.  You lose weight.  You are bleeding from the anus.  You have thin, pencil-like poop (stool). Get help right away if:  You have a fever, and your symptoms suddenly get worse.  You leak poop or have blood in your poop.  Your belly feels hard or bigger than normal (is bloated).  You have very bad belly pain.  You feel dizzy or you faint. This information is not intended to replace advice given to you by your health care provider. Make sure you discuss any questions you have with your health care provider. Document Revised: 08/11/2017 Document Reviewed: 02/17/2016 Elsevier Patient Education  [place]. Gastroesophageal Reflux  Disease, Adult Gastroesophageal reflux (GER) happens when acid from the stomach flows up into the tube that connects the mouth and the stomach (esophagus). Normally, food travels down the esophagus and stays in the stomach to be digested. With GER, food and stomach acid sometimes move back up into the esophagus. You may have a disease called gastroesophageal reflux disease (GERD) if the reflux:  Happens often.  Causes frequent or very bad symptoms.  Causes problems such as damage to the esophagus. When this happens, the esophagus becomes sore and swollen (inflamed). Over time, GERD can make small holes (ulcers) in the lining of the esophagus. What are the causes? This condition is caused by a problem with the muscle between the esophagus and the stomach. When this muscle is weak or not normal, it does not close properly to keep food and acid from coming back up from the stomach. The muscle can be weak because of:  Tobacco use.  Pregnancy.  Having a certain type of hernia (hiatal hernia).  Alcohol use.  Certain foods and drinks, such as coffee, chocolate, onions, and peppermint. What increases the risk? You are more likely to develop this condition if you:  Are overweight.  Have a disease that affects your connective tissue.  Use NSAID medicines. What are the signs or symptoms? Symptoms of this condition include:  Heartburn.  Difficult or painful swallowing.  The feeling of having a lump in the throat.  A bitter taste in the  mouth.  Bad breath.  Having a lot of saliva.  Having an upset or bloated stomach.  Belching.  Chest pain. Different conditions can cause chest pain. Make sure you see your doctor if you have chest pain.  Shortness of breath or noisy breathing (wheezing).  Ongoing (chronic) cough or a cough at night.  Wearing away of the surface of teeth (tooth enamel).  Weight loss. How is this treated? Treatment will depend on how bad your symptoms are.  Your doctor may suggest:  Changes to your diet.  Medicine.  Surgery. Follow these instructions at home: Eating and drinking   Follow a diet as told by your doctor. You may need to avoid foods and drinks such as: ? Coffee and tea (with or without caffeine). ? Drinks that contain alcohol. ? Energy drinks and sports drinks. ? Bubbly (carbonated) drinks or sodas. ? Chocolate and cocoa. ? Peppermint and mint flavorings. ? Garlic and onions. ? Horseradish. ? Spicy and acidic foods. These include peppers, chili powder, curry powder, vinegar, hot sauces, and BBQ sauce. ? Citrus fruit juices and citrus fruits, such as oranges, lemons, and limes. ? Tomato-based foods. These include red sauce, chili, salsa, and pizza with red sauce. ? Fried and fatty foods. These include donuts, french fries, potato chips, and high-fat dressings. ? High-fat meats. These include hot dogs, rib eye steak, sausage, ham, and bacon. ? High-fat dairy items, such as whole milk, butter, and cream cheese.  Eat small meals often. Avoid eating large meals.  Avoid drinking large amounts of liquid with your meals.  Avoid eating meals during the 2-3 hours before bedtime.  Avoid lying down right after you eat.  Do not exercise right after you eat. Lifestyle   Do not use any products that contain nicotine or tobacco. These include cigarettes, e-cigarettes, and chewing tobacco. If you need help quitting, ask your doctor.  Try to lower your stress. If you need help doing this, ask your doctor.  If you are overweight, lose an amount of weight that is healthy for you. Ask your doctor about a safe weight loss goal. General instructions  Pay attention to any changes in your symptoms.  Take over-the-counter and prescription medicines only as told by your doctor. Do not take aspirin, ibuprofen, or other NSAIDs unless your doctor says it is okay.  Wear loose clothes. Do not wear anything tight around your waist.   Raise (elevate) the head of your bed about 6 inches (15 cm).  Avoid bending over if this makes your symptoms worse.  Keep all follow-up visits as told by your doctor. This is important. Contact a doctor if:  You have new symptoms.  You lose weight and you do not know why.  You have trouble swallowing or it hurts to swallow.  You have wheezing or a cough that keeps happening.  Your symptoms do not get better with treatment.  You have a hoarse voice. Get help right away if:  You have pain in your arms, neck, jaw, teeth, or back.  You feel sweaty, dizzy, or light-headed.  You have chest pain or shortness of breath.  You throw up (vomit) and your throw-up looks like blood or coffee grounds.  You pass out (faint).  Your poop (stool) is bloody or black.  You cannot swallow, drink, or eat. Summary  If a person has gastroesophageal reflux disease (GERD), food and stomach acid move back up into the esophagus and cause symptoms or problems such as damage to the  esophagus.  Treatment will depend on how bad your symptoms are.  Follow a diet as told by your doctor.  Take all medicines only as told by your doctor. This information is not intended to replace advice given to you by your health care provider. Make sure you discuss any questions you have with your health care provider. Document Revised: 03/07/2018 Document Reviewed: 03/07/2018 Elsevier Patient Education  [place].

## 2020-01-03 NOTE — Progress Notes (Signed)
Established Patient Office Visit  Subjective:  Patient ID: Kirsten Torres, female    DOB: 1971-06-12  Age: 49 y.o. MRN: [id number]  CC:  Chief Complaint  Patient presents with  . Gastroesophageal Reflux  . Establish Care    re-establish care. Asking for refills for linzess and omeprazole.     HPI Kirsten Torres presents to reestablish care. She  has a past medical history of  GERD (gastroesophageal reflux disease).  Constipation Patient complains of constipation. Onset was several months ago. Patient has been having occasional blood tinged stools per week. Defecation has been difficult and painful. She has some current rectal irritation.   Last episode of constipation once last week.  She has some residual back pain and lower quadrant abdominal pain.  She does feel like her urination is affected.  Co-Morbid conditions:none. Symptoms have gradually worsened. Current Health Habits: Eating fiber? yes -fruits and vegetables, Exercise? yes - , Adequate hydration? yes - . Current over the counter/prescription laxative: None Plenty . She admits that it makes the hemorrhoids internal, pervious colonoscopy with no additional abnormalities.  She has been on Linzess in the past which was effective.  Past Medical History:  Diagnosis Date  . Genital mutilation, female   . GERD (gastroesophageal reflux disease)   . SVD (spontaneous vaginal delivery) 05/28/2011    Past Surgical History:  Procedure Laterality Date  . genital mutilation    . TONSILLECTOMY  2004    Family History  Problem Relation Age of Onset  . Colon cancer Neg Hx   . Colon polyps Neg Hx     Social History   Socioeconomic History  . Marital status: Married    Spouse name: Not on file  . Number of children: 2  . Years of education: master's  . Highest education level: Not on file  Occupational History  . Occupation: homemaker  Tobacco Use  . Smoking status: Never Smoker  . Smokeless tobacco: Never Used   Substance and Sexual Activity  . Alcohol use: No  . Drug use: No  . Sexual activity: Yes    Partners: Male    Birth control/protection: None    Comment: stopped COC to become pregnant  Other Topics Concern  . Not on file  Social History Narrative   From [place]. Came to the [place] in 2011. Master's Degree in [employer].   Lives with her husband (came here from [place] in 2004) and their 2 children.   Social Determinants of Health   Financial Resource Strain:   . Difficulty of Paying Living Expenses:   Food Insecurity:   . Worried About Charity fundraiser in the Last Year:   . Arboriculturist in the Last Year:   Transportation Needs:   . Film/video editor (Medical):   Marland Kitchen Lack of Transportation (Non-Medical):   Physical Activity:   . Days of Exercise per Week:   . Minutes of Exercise per Session:   Stress:   . Feeling of Stress :   Social Connections:   . Frequency of Communication with Friends and Family:   . Frequency of Social Gatherings with Friends and Family:   . Attends Religious Services:   . Active Member of Clubs or Organizations:   . Attends Archivist Meetings:   Marland Kitchen Marital Status:   Intimate Partner Violence:   . Fear of Current or Ex-Partner:   . Emotionally Abused:   Marland Kitchen Physically Abused:   . Sexually Abused:  Outpatient Medications Prior to Visit  Medication Sig Dispense Refill  . Naproxen Sodium (ALEVE PO) Take by mouth.    . calcium carbonate (TUMS - DOSED IN MG ELEMENTAL CALCIUM) 500 MG chewable tablet Chew 1 tablet by mouth daily.    . hydrocortisone (ANUSOL-HC) 2.5 % rectal cream Place 1 application rectally at bedtime. (Patient not taking: Reported on 08/26/2019) 28.35 g 1  . linaclotide (LINZESS) 145 MCG CAPS capsule Take 1 capsule (145 mcg total) by mouth daily before breakfast. (Patient not taking: Reported on 01/03/2020) 90 capsule 1  . megestrol (MEGACE) 40 MG tablet Take 1 tablet (40 mg total) by mouth 2 (two) times daily. Can  increase to two tablets twice a day in the event of heavy bleeding (Patient not taking: Reported on 08/26/2019) 60 tablet 3  . omeprazole (PRILOSEC) 20 MG capsule Take 1 capsule (20 mg total) by mouth daily. Will need to schedule appointment for refills (Patient not taking: Reported on 01/03/2020) 30 capsule 0   No facility-administered medications prior to visit.    Allergies  Allergen Reactions  . Iron     Had reaction to iron infusion     ROS Review of Systems  Skin:       Varicose veins to right upper thigh  All other systems reviewed and are negative.     Objective:    Physical Exam  Constitutional: She is oriented to person, place, and time. She appears well-developed and well-nourished.  HENT:  Head: Normocephalic.  Eyes: Pupils are equal, round, and reactive to light.  Cardiovascular: Normal rate, regular rhythm, normal heart sounds and intact distal pulses.  Pulmonary/Chest: Effort normal and breath sounds normal.  Abdominal: Soft. Bowel sounds are normal. There is abdominal tenderness.  Lower abdomen  Musculoskeletal:        General: Normal range of motion.     Cervical back: Normal range of motion and neck supple.  Neurological: She is alert and oriented to person, place, and time.  Skin: Skin is warm and dry.  Left thigh spider veins    BP 108/85 (BP Location: Left Arm, Patient Position: Sitting, Cuff Size: Normal)   Pulse 90   Temp 98.2 F (36.8 C) (Oral)   Resp 14   Ht 5\' 5"  (1.651 m)   Wt 188 lb (85.3 kg)   LMP 12/15/2019   SpO2 100%   BMI 31.28 kg/m  Wt Readings from Last 3 Encounters:  01/03/20 188 lb (85.3 kg)  08/26/19 188 lb (85.3 kg)  07/05/19 188 lb (85.3 kg)     Health Maintenance Due  Topic Date Due  . COVID-19 Vaccine (1) Never done    There are no preventive care reminders to display for this patient.  Lab Results  Component Value Date   TSH 2.170 07/05/2019   Lab Results  Component Value Date   WBC 5.9 07/05/2019   HGB  13.4 07/05/2019   HCT 39.9 07/05/2019   MCV 86 07/05/2019   PLT 251 07/05/2019   Lab Results  Component Value Date   NA 138 10/19/2018   K 3.9 10/19/2018   CO2 22 10/19/2018   GLUCOSE 95 10/19/2018   BUN 9 10/19/2018   CREATININE 0.73 10/19/2018   BILITOT 0.3 10/19/2018   ALKPHOS 64 10/19/2018   AST 11 10/19/2018   ALT 6 10/19/2018   PROT 6.8 10/19/2018   ALBUMIN 4.1 10/19/2018   CALCIUM 9.6 10/19/2018   Lab Results  Component Value Date   CHOL 133 06/21/2016  Lab Results  Component Value Date   HDL 47 06/21/2016   Lab Results  Component Value Date   LDLCALC 74 06/21/2016   Lab Results  Component Value Date   TRIG 60 06/21/2016   Lab Results  Component Value Date   CHOLHDL 2.8 06/21/2016   Lab Results  Component Value Date   HGBA1C 5.3 11/16/2015      Assessment & Plan:   Problem List Items Addressed This Visit      High   Chronic constipation   Relevant Medications   linaclotide (LINZESS) 145 MCG CAPS capsule   Other Relevant Orders   TSH    Other Visit Diagnoses    Healthcare maintenance    -  Primary   Relevant Orders   Comp. Metabolic Panel (12)   Vitamin B12   Magnesium   VITAMIN D 25 Hydroxy (Vit-D Deficiency, Fractures)   POCT URINALYSIS DIP (CLINITEK)   Gastroesophageal reflux disease       refill on Omeprazole    Relevant Medications   linaclotide (LINZESS) 145 MCG CAPS capsule   omeprazole (PRILOSEC) 20 MG capsule   Iron deficiency anemia, unspecified iron deficiency anemia type       labs pending   Relevant Orders   CBC with Differential/Platelet   Iron, TIBC and Ferritin Panel   Spider veins          Meds ordered this encounter  Medications  . linaclotide (LINZESS) 145 MCG CAPS capsule    Sig: Take 1 capsule (145 mcg total) by mouth daily before breakfast.    Dispense:  90 capsule    Refill:  1    Order Specific Question:   Supervising Provider    Answer:   Tresa Garter [id number]  . omeprazole (PRILOSEC) 20  MG capsule    Sig: Take 1 capsule (20 mg total) by mouth daily. Will need to schedule appointment for refills    Dispense:  30 capsule    Refill:  1    Order Specific Question:   Supervising Provider    Answer:   Tresa Garter [id number]    Follow-up: Return in about 6 months (around 07/04/2020).    Vevelyn Francois, NP

## 2020-01-04 LAB — CBC WITH DIFFERENTIAL/PLATELET
Basophils Absolute: 0 10*3/uL (ref 0.0–0.2)
Basos: 0 %
EOS (ABSOLUTE): 0.1 10*3/uL (ref 0.0–0.4)
Eos: 1 %
Hematocrit: 38 % (ref 34.0–46.6)
Hemoglobin: 12.6 g/dL (ref 11.1–15.9)
Immature Grans (Abs): 0 10*3/uL (ref 0.0–0.1)
Immature Granulocytes: 0 %
Lymphocytes Absolute: 2 10*3/uL (ref 0.7–3.1)
Lymphs: 39 %
MCH: 28 pg (ref 26.6–33.0)
MCHC: 33.2 g/dL (ref 31.5–35.7)
MCV: 84 fL (ref 79–97)
Monocytes Absolute: 0.5 10*3/uL (ref 0.1–0.9)
Monocytes: 9 %
Neutrophils Absolute: 2.6 10*3/uL (ref 1.4–7.0)
Neutrophils: 51 %
Platelets: 243 10*3/uL (ref 150–450)
RBC: 4.5 x10E6/uL (ref 3.77–5.28)
RDW: 12.1 % (ref 11.7–15.4)
WBC: 5.2 10*3/uL (ref 3.4–10.8)

## 2020-01-04 LAB — COMP. METABOLIC PANEL (12)
AST: 11 IU/L (ref 0–40)
Albumin/Globulin Ratio: 1.4 (ref 1.2–2.2)
Albumin: 4.1 g/dL (ref 3.8–4.8)
Alkaline Phosphatase: 79 IU/L (ref 39–117)
BUN/Creatinine Ratio: 12 (ref 9–23)
BUN: 8 mg/dL (ref 6–24)
Bilirubin Total: <0.2 mg/dL (ref 0.0–1.2)
Calcium: 9.2 mg/dL (ref 8.7–10.2)
Chloride: 104 mmol/L (ref 96–106)
Creatinine, Ser: 0.66 mg/dL (ref 0.57–1.00)
GFR calc Af Amer: 123 mL/min/{1.73_m2} (ref >59)
GFR calc non Af Amer: 106 mL/min/{1.73_m2} (ref >59)
Globulin, Total: 2.9 g/dL (ref 1.5–4.5)
Glucose: 97 mg/dL (ref 65–99)
Potassium: 4 mmol/L (ref 3.5–5.2)
Sodium: 139 mmol/L (ref 134–144)
Total Protein: 7 g/dL (ref 6.0–8.5)

## 2020-01-04 LAB — TSH: TSH: 0.911 u[IU]/mL (ref 0.450–4.500)

## 2020-01-04 LAB — IRON,TIBC AND FERRITIN PANEL
Ferritin: 22 ng/mL (ref 15–150)
Iron Saturation: 15 % (ref 15–55)
Iron: 49 ug/dL (ref 27–159)
Total Iron Binding Capacity: 324 ug/dL (ref 250–450)
UIBC: 275 ug/dL (ref 131–425)

## 2020-01-04 LAB — VITAMIN D 25 HYDROXY (VIT D DEFICIENCY, FRACTURES): Vit D, 25-Hydroxy: 18.6 ng/mL — ABNORMAL LOW (ref 30.0–100.0)

## 2020-01-04 LAB — VITAMIN B12: Vitamin B-12: 548 pg/mL (ref 232–1245)

## 2020-01-04 LAB — MAGNESIUM: Magnesium: 1.9 mg/dL (ref 1.6–2.3)

## 2020-01-06 ENCOUNTER — Other Ambulatory Visit: Payer: Self-pay | Admitting: Nurse Practitioner

## 2020-01-06 MED ORDER — ERGOCALCIFEROL 1.25 MG (50000 UT) PO CAPS: 50000.0000 [IU] | ORAL_CAPSULE | ORAL | 0 refills | Status: AC

## 2020-01-22 DIAGNOSIS — L500 Allergic urticaria: Secondary | ICD-10-CM | POA: Diagnosis not present

## 2020-01-22 DIAGNOSIS — T2040XA Corrosion of unspecified degree of head, face, and neck, unspecified site, initial encounter: Secondary | ICD-10-CM | POA: Diagnosis not present

## 2020-01-24 ENCOUNTER — Encounter (HOSPITAL_BASED_OUTPATIENT_CLINIC_OR_DEPARTMENT_OTHER): Payer: Self-pay

## 2020-01-24 ENCOUNTER — Other Ambulatory Visit: Payer: Self-pay

## 2020-01-24 ENCOUNTER — Emergency Department (HOSPITAL_BASED_OUTPATIENT_CLINIC_OR_DEPARTMENT_OTHER)
Admission: EM | Admit: 2020-01-24 | Discharge: 2020-01-24 | Disposition: A | Discharge status: Home / Self Care | Payer: Medicaid Other | Attending: Emergency Medicine | Admitting: Emergency Medicine

## 2020-01-24 DIAGNOSIS — R21 Rash and other nonspecific skin eruption: Secondary | ICD-10-CM | POA: Diagnosis present

## 2020-01-24 DIAGNOSIS — L245 Irritant contact dermatitis due to other chemical products: Secondary | ICD-10-CM | POA: Diagnosis not present

## 2020-01-24 MED ORDER — TRIAMCINOLONE ACETONIDE 0.1 % EX CREA: 1.0000 "application " | TOPICAL_CREAM | Freq: Two times a day (BID) | CUTANEOUS | 0 refills | Status: DC

## 2020-01-24 MED ORDER — DEXAMETHASONE SODIUM PHOSPHATE 10 MG/ML IJ SOLN
10.0000 mg | Freq: Once | INTRAMUSCULAR | Status: AC
  Administered 2020-01-24: 10 mg via INTRAMUSCULAR
  Filled 2020-01-24: qty 1

## 2020-01-24 NOTE — Discharge Instructions (Signed)
Use the cream to your eyebrows every 2 days.  Please cool compress to eyes. Take the allergy medication daily.  Follow up with Primary care doc next week

## 2020-01-24 NOTE — ED Provider Notes (Signed)
[facility] EMERGENCY DEPARTMENT Provider Note   CSN: [id number] Arrival date & time: 01/24/20  1401    History Chief Complaint  Patient presents with  . Allergic Reaction    Kirsten Torres is a 49 y.o. female with possible history significant for chronic abdominal pain who presents for evaluation of allergic reaction.  Patient had eyebrows microbleed on 01/21/2020.  Has history of allergic reaction to dyes such as in the tattoos.  Apparently company told her this was not similar.  Patient states immediately she developed pruritus to her bilateral eyebrows.  Next day noticed erythematous papular rash to eyebrows and some swelling surrounding her bilateral eyelids.  Was seen by urgent care and given Silvadene cream.  Patient states this medication has not been helping.  No vision changes, eye pain.  No evidence of sensation of throat closing, intraoral swelling, chest pain, shortness of breath, urticaria.  Has been intermittently taking OTC Claritin which she states has not helped.  Denies additional aggravating or relieving factors.  No pain.  History obtained from patient past medical records.  No interpreter used.  HPI     Past Medical History:  Diagnosis Date  . Genital mutilation, female   . GERD (gastroesophageal reflux disease)   . SVD (spontaneous vaginal delivery) 05/28/2011    Patient Active Problem List   Diagnosis Date Noted  . Chronic constipation 01/19/2017  . Dysuria 01/19/2017  . Generalized abdominal pain 01/19/2017  . Pain in the chest 11/16/2015  . Gastroesophageal reflux disease without esophagitis 11/16/2015  . Other fatigue 11/16/2015    Past Surgical History:  Procedure Laterality Date  . genital mutilation    . TONSILLECTOMY  2004     OB History    Gravida  3   Para  2   Term  2   Preterm  0   AB  0   Living  2     SAB  0   TAB  0   Ectopic  0   Multiple  0   Live Births  2           Family History  Problem  Relation Age of Onset  . Colon cancer Neg Hx   . Colon polyps Neg Hx     Social History   Tobacco Use  . Smoking status: Never Smoker  . Smokeless tobacco: Never Used  Substance Use Topics  . Alcohol use: No  . Drug use: No    Home Medications Prior to Admission medications   Medication Sig Start Date End Date Taking? Authorizing Provider  ergocalciferol (VITAMIN D2) 1.25 MG (50000 UT) capsule Take 1 capsule (50,000 Units total) by mouth once a week. X 12 weeks. 01/06/20 04/05/20  Vevelyn Francois, NP  linaclotide [facility]) 145 MCG CAPS capsule Take 1 capsule (145 mcg total) by mouth daily before breakfast. 01/03/20   Vevelyn Francois, NP  Naproxen Sodium (ALEVE PO) Take by mouth.    [provider]  omeprazole (PRILOSEC) 20 MG capsule Take 1 capsule (20 mg total) by mouth daily. Will need to schedule appointment for refills 01/03/20   Vevelyn Francois, NP  triamcinolone cream (KENALOG) 0.1 % Apply 1 application topically 2 (two) times daily. 01/24/20   Dailyn Kempner A, PA-C    Allergies    Iron  Review of Systems   Review of Systems  Constitutional: Negative.   HENT: Negative.   Respiratory: Negative.   Cardiovascular: Negative.  Negative for chest pain.  Gastrointestinal: Negative.   Genitourinary: Negative.   Musculoskeletal: Negative.   Skin: Positive for rash.  Neurological: Negative.   All other systems reviewed and are negative.   Physical Exam Updated Vital Signs BP 113/84 (BP Location: Right Arm)   Pulse 92   Temp 98.4 F (36.9 C) (Oral)   Resp 16   Ht 5\' 5"  (1.651 m)   Wt 84.8 kg   LMP 01/15/2020   SpO2 99%   BMI 31.12 kg/m   Physical Exam Vitals and nursing note reviewed.  Constitutional:      General: She is not in acute distress.    Appearance: She is well-developed. She is not ill-appearing, toxic-appearing or diaphoretic.  HENT:     Head: Normocephalic and atraumatic. No raccoon eyes, Battle's sign, abrasion, contusion, masses, right  periorbital erythema, left periorbital erythema or laceration.     Jaw: There is normal jaw occlusion.     Comments: Rash to bilateral eye lids    Nose: Nose normal.     Mouth/Throat:     Lips: Pink.     Mouth: Mucous membranes are moist.     Pharynx: Oropharynx is clear. Uvula midline.     Comments: No intraoral swelling, lesions Eyes:     General: Lids are everted, no foreign bodies appreciated.     Extraocular Movements: Extraocular movements intact.     Conjunctiva/sclera: Conjunctivae normal.     Pupils: Pupils are equal, round, and reactive to light.     Comments: Periorbital swelling to bilateral eye lids. No erythema, warmth. PERRLA.   Cardiovascular:     Rate and Rhythm: Normal rate.     Pulses: Normal pulses.     Heart sounds: Normal heart sounds.  Pulmonary:     Effort: Pulmonary effort is normal. No respiratory distress.     Breath sounds: Normal breath sounds.  Abdominal:     General: Bowel sounds are normal. There is no distension.  Musculoskeletal:        General: Normal range of motion.     Cervical back: Normal range of motion.  Skin:    General: Skin is warm and dry.     Capillary Refill: Capillary refill takes less than 2 seconds.     Findings: Erythema and rash present. No abscess, bruising, burn or ecchymosis.     Comments: Erythematous papules to bilateral eye brows.  Noffluctuance or induration.  Neurological:     General: No focal deficit present.     Mental Status: She is alert and oriented to person, place, and time.      ED Results / Procedures / Treatments   Labs (all labs ordered are listed, but only abnormal results are displayed) Labs Reviewed - No data to display  EKG None  Radiology No results found.  Procedures Procedures (including critical care time)  Medications Ordered in ED Medications  dexamethasone (DECADRON) injection 10 mg (10 mg Intramuscular Given 01/24/20 1455)    ED Course  I have reviewed the triage vital signs  and the nursing notes.  Pertinent labs & imaging results that were available during my care of the patient were reviewed by me and considered in my medical decision making (see chart for details).  49 year old presents for evaluation of rash.  Has allergy to dye.  Recently had her eyebrows microbladded 3 days ago.  Was seen by urgent care and given Silvadene cream.  Patient with persistent symptoms.  Patient with bilateral eyelid swelling as well as erythematous  papular rash to bilateral eyebrows.  No fluctuance or induration.  Does not appear infected.  No pain with EOMs.  I have low suspicion for orbital or periorbital cellulitis.  No evidence of intraoral edema, sensation of throat closing or oral lesions.  No chest pain, shortness of breath or urticaria.  Likely irritant contact dermatitis.  Will give Decadron here in ED, antihistamines at home as well as cream for home use.  She will follow-up outpatient with PCP for recheck in 2 to 3 days.  May return for any worsening symptoms.  Patient denies any difficulty breathing or swallowing.  Pt has a patent airway without stridor and is handling secretions without difficulty; no angioedema. No blisters, no warmth, no draining sinus tracts, no superficial abscesses, no bullous impetigo, no vesicles, no desquamation, no target lesions with dusky purpura or a central bulla. Not tender to touch. No concern for superimposed infection. No concern for SJS, TEN, TSS, tick borne illness, syphilis or other life-threatening condition.  The patient has been appropriately medically screened and/or stabilized in the ED. I have low suspicion for any other emergent medical condition which would require further screening, evaluation or treatment in the ED or require inpatient management.  Patient is hemodynamically stable and in no acute distress.  Patient able to ambulate in department prior to ED.  Evaluation does not show acute pathology that would require ongoing or  additional emergent interventions while in the emergency department or further inpatient treatment.  I have discussed the diagnosis with the patient and answered all questions.  Pain is been managed while in the emergency department and patient has no further complaints prior to discharge.  Patient is comfortable with plan discussed in room and is stable for discharge at this time.  I have discussed strict return precautions for returning to the emergency department.  Patient was encouraged to follow-up with PCP/specialist refer to at discharge.    MDM Rules/Calculators/A&P                       Final Clinical Impression(s) / ED Diagnoses Final diagnoses:  Irritant contact dermatitis due to other chemical products    Rx / DC Orders ED Discharge Orders         Ordered    triamcinolone cream (KENALOG) 0.1 %  2 times daily     01/24/20 1517           Avigdor Dollar A, PA-C 01/24/20 1527    Dorie Rank, MD 01/27/20 [id number]

## 2020-01-24 NOTE — ED Triage Notes (Addendum)
PT had a cosmetic product applied to her eyebrow region on 5/11. That night pt began to have a rash in that area. Area is erythremic with papular rash present during triage. Pt was seen on 5/12 by UC and was given a silver sulfadiazine cream to apply to the area.

## 2020-05-21 ENCOUNTER — Ambulatory Visit: Payer: Medicaid Other | Admitting: Nurse Practitioner

## 2020-05-29 ENCOUNTER — Other Ambulatory Visit: Payer: Self-pay

## 2020-05-29 ENCOUNTER — Encounter: Payer: Self-pay | Admitting: Nurse Practitioner

## 2020-05-29 ENCOUNTER — Ambulatory Visit (INDEPENDENT_AMBULATORY_CARE_PROVIDER_SITE_OTHER): Payer: Medicaid Other | Admitting: Nurse Practitioner

## 2020-05-29 VITALS — BP 105/69 | HR 106 | Temp 97.4°F | Ht 65.0 in | Wt 196.2 lb

## 2020-05-29 DIAGNOSIS — K5909 Other constipation: Secondary | ICD-10-CM | POA: Diagnosis not present

## 2020-05-29 DIAGNOSIS — D509 Iron deficiency anemia, unspecified: Secondary | ICD-10-CM

## 2020-05-29 DIAGNOSIS — K2100 Gastro-esophageal reflux disease with esophagitis, without bleeding: Secondary | ICD-10-CM

## 2020-05-29 MED ORDER — OMEPRAZOLE 20 MG PO CPDR: 20.0000 mg | DELAYED_RELEASE_CAPSULE | Freq: Every day | ORAL | 3 refills | Status: DC

## 2020-05-29 MED ORDER — LINACLOTIDE 145 MCG PO CAPS: 145.0000 ug | ORAL_CAPSULE | Freq: Every day | ORAL | 3 refills | Status: DC

## 2020-05-29 MED ORDER — TRIAMCINOLONE ACETONIDE 0.1 % EX CREA: 1.0000 "application " | TOPICAL_CREAM | Freq: Two times a day (BID) | CUTANEOUS | 3 refills | Status: AC

## 2020-05-29 NOTE — Progress Notes (Signed)
[place], [place]  [address] Phone:  [phone]   Fax:  [phone]   Established Patient Office Visit  Subjective:  Patient ID: Kirsten Torres, female    DOB: 1971/01/28  Age: 49 y.o. MRN: [id number]  CC:  Chief Complaint  Patient presents with   Follow-up    HPI Kirsten Torres presents for follow up. She  has a past medical history of Genital mutilation, female, GERD (gastroesophageal reflux disease), and SVD (spontaneous vaginal delivery) (05/28/2011).   Constipation Patient complains of constipation. Onset was several months ago. Patient has been having frequent firm stools per week. Defecation has been difficult. Co-Morbid conditions:none. Symptoms have progressed to a point and plateaued. Current Health Habits: Eating fiber? Tries  - , Exercise? A work in progress-, Adequate hydration? yes - . Current prescription : Linzess which has been effective.She uses the Linzess every other day. She admits that taking the it 3 days in a row is too much because she then develops watery stools.  She QOD soft stool.    Hemorrhoids Patient presents for follow up of hemorrhoids. Onset of symptoms was gradual. Symptoms have been unchanged since that time. Symptoms include: bleeding which only occurs with bowel movements, constipation and pain with sitting. Patient denies family hx of colorectal CA, history of previous STDs, maroon colored stools, melena and receptive anal intercourse. Treatment to date has been treat constipation. There is not trigger. Noted with constipation and with sitting for long periods.  She traveled to [place]. She was seen by Gyn in [place] and given kapron, cabergoline and DHEA.   Past Medical History:  Diagnosis Date   Genital mutilation, female    GERD (gastroesophageal reflux disease)    SVD (spontaneous vaginal delivery) 05/28/2011    Past Surgical History:  Procedure Laterality Date   genital mutilation      TONSILLECTOMY  2004    Family History  Problem Relation Age of Onset   Colon cancer Neg Hx    Colon polyps Neg Hx     Social History   Socioeconomic History   Marital status: Married    Spouse name: Not on file   Number of children: 2   Years of education: master's   Highest education level: Not on file  Occupational History   Occupation: homemaker  Tobacco Use   Smoking status: Never Smoker   Smokeless tobacco: Never Used  Scientific laboratory technician Use: Never used  Substance and Sexual Activity   Alcohol use: No   Drug use: No   Sexual activity: Yes    Partners: Male    Birth control/protection: None    Comment: stopped COC to become pregnant  Other Topics Concern   Not on file  Social History Narrative   From [place]. Came to the [place] in 2011. Master's Degree in [employer].   Lives with her husband (came here from [place] in 2004) and their 2 children.   Social Determinants of Health   Financial Resource Strain:    Difficulty of Paying Living Expenses: Not on file  Food Insecurity:    Worried About Charity fundraiser in the Last Year: Not on file   [employer] of Food in the Last Year: Not on file  Transportation Needs:    Lack of Transportation (Medical): Not on file   Lack of Transportation (Non-Medical): Not on file  Physical Activity:    Days of Exercise per Week: Not  on file   Minutes of Exercise per Session: Not on file  Stress:    Feeling of Stress : Not on file  Social Connections:    Frequency of Communication with Friends and Family: Not on file   Frequency of Social Gatherings with Friends and Family: Not on file   Attends Religious Services: Not on file   Active Member of Clubs or Organizations: Not on file   Attends Archivist Meetings: Not on file   Marital Status: Not on file  Intimate Partner Violence:    Fear of Current or Ex-Partner: Not on file   Emotionally Abused: Not on file   Physically Abused: Not  on file   Sexually Abused: Not on file    Outpatient Medications Prior to Visit  Medication Sig Dispense Refill   cabergoline (DOSTINEX) 0.5 MG tablet Take 0.25 mg by mouth 2 (two) times a week.     DHEA 50 MG TABS Take by mouth.     Naproxen Sodium (ALEVE PO) Take by mouth.     linaclotide (LINZESS) 145 MCG CAPS capsule Take 1 capsule (145 mcg total) by mouth daily before breakfast. 90 capsule 1   omeprazole (PRILOSEC) 20 MG capsule Take 1 capsule (20 mg total) by mouth daily. Will need to schedule appointment for refills 30 capsule 1   triamcinolone cream (KENALOG) 0.1 % Apply 1 application topically 2 (two) times daily. 30 g 0   No facility-administered medications prior to visit.    Allergies  Allergen Reactions   Iron     Had reaction to iron infusion     ROS Review of Systems  Constitutional: Negative.   HENT: Negative.   Eyes: Negative.   Respiratory: Negative for shortness of breath.   Cardiovascular: Negative for chest pain.  Gastrointestinal: Positive for abdominal pain and constipation.       Hemorrhoids   Endocrine: Negative.   Allergic/Immunologic: Negative.       Objective:    Physical Exam Constitutional:      General: She is not in acute distress.    Appearance: She is obese. She is not ill-appearing, toxic-appearing or diaphoretic.  HENT:     Nose: Nose normal.     Mouth/Throat:     Mouth: Mucous membranes are moist.  Cardiovascular:     Rate and Rhythm: Normal rate and regular rhythm.     Pulses: Normal pulses.     Heart sounds: Normal heart sounds.  Pulmonary:     Effort: Pulmonary effort is normal.     Breath sounds: Normal breath sounds.  Abdominal:     Palpations: Abdomen is soft.     Comments: Increased abdominal girth  Musculoskeletal:        General: Normal range of motion.     Cervical back: Normal range of motion.  Skin:    General: Skin is warm.     Capillary Refill: Capillary refill takes less than 2 seconds.    Neurological:     General: No focal deficit present.     Mental Status: She is alert and oriented to person, place, and time.  Psychiatric:        Mood and Affect: Mood normal.        Behavior: Behavior normal.        Thought Content: Thought content normal.        Judgment: Judgment normal.      BP 105/69    Pulse (!) 106    Temp (!) 97.4  F (36.3 C) (Temporal)    Ht 5\' 5"  (1.651 m)    Wt 196 lb 3.2 oz (89 kg)    SpO2 100%    BMI 32.65 kg/m  Wt Readings from Last 3 Encounters:  05/29/20 196 lb 3.2 oz (89 kg)  01/24/20 187 lb (84.8 kg)  01/03/20 188 lb (85.3 kg)     Health Maintenance Due  Topic Date Due   Hepatitis C Screening  Never done   PAP SMEAR-Modifier  06/02/2020    There are no preventive care reminders to display for this patient.  Lab Results  Component Value Date   TSH 0.911 01/03/2020   Lab Results  Component Value Date   WBC 5.2 01/03/2020   HGB 12.6 01/03/2020   HCT 38.0 01/03/2020   MCV 84 01/03/2020   PLT 243 01/03/2020   Lab Results  Component Value Date   NA 139 01/03/2020   K 4.0 01/03/2020   CO2 22 10/19/2018   GLUCOSE 97 01/03/2020   BUN 8 01/03/2020   CREATININE 0.66 01/03/2020   BILITOT <0.2 01/03/2020   ALKPHOS 79 01/03/2020   AST 11 01/03/2020   ALT 6 10/19/2018   PROT 7.0 01/03/2020   ALBUMIN 4.1 01/03/2020   CALCIUM 9.2 01/03/2020   Lab Results  Component Value Date   CHOL 133 06/21/2016   Lab Results  Component Value Date   HDL 47 06/21/2016   Lab Results  Component Value Date   LDLCALC 74 06/21/2016   Lab Results  Component Value Date   TRIG 60 06/21/2016   Lab Results  Component Value Date   CHOLHDL 2.8 06/21/2016   Lab Results  Component Value Date   HGBA1C 5.3 11/16/2015      Assessment & Plan:   Problem List Items Addressed This Visit      Digestive   Chronic constipation Continue with current regimen encourage dietary changes, increase hydration with water and regular daily exercise    Relevant Medications   linaclotide (LINZESS) 145 MCG CAPS capsule    Other Visit Diagnoses    Iron deficiency anemia, unspecified iron deficiency anemia type    -  Primary   Relevant Orders   CBC with Differential/Platelet   Iron, TIBC and Ferritin Panel   Gastroesophageal reflux disease with esophagitis without hemorrhage       refill on Omeprazole    Relevant Medications   omeprazole (PRILOSEC) 20 MG capsule      Meds ordered this encounter  Medications   linaclotide (LINZESS) 145 MCG CAPS capsule    Sig: Take 1 capsule (145 mcg total) by mouth daily before breakfast.    Dispense:  90 capsule    Refill:  3   omeprazole (PRILOSEC) 20 MG capsule    Sig: Take 1 capsule (20 mg total) by mouth daily. Will need to schedule appointment for refills    Dispense:  90 capsule    Refill:  3   triamcinolone cream (KENALOG) 0.1 %    Sig: Apply 1 application topically 2 (two) times daily.    Dispense:  180 g    Refill:  3    Follow-up: Return in about 6 months (around 11/26/2020).    Vevelyn Francois, NP

## 2020-05-29 NOTE — Patient Instructions (Signed)

## 2020-05-30 LAB — CBC WITH DIFFERENTIAL/PLATELET
Basophils Absolute: 0 10*3/uL (ref 0.0–0.2)
Basos: 1 %
EOS (ABSOLUTE): 0.1 10*3/uL (ref 0.0–0.4)
Eos: 1 %
Hematocrit: 37.7 % (ref 34.0–46.6)
Hemoglobin: 12.3 g/dL (ref 11.1–15.9)
Immature Grans (Abs): 0 10*3/uL (ref 0.0–0.1)
Immature Granulocytes: 0 %
Lymphocytes Absolute: 2.4 10*3/uL (ref 0.7–3.1)
Lymphs: 46 %
MCH: 27.3 pg (ref 26.6–33.0)
MCHC: 32.6 g/dL (ref 31.5–35.7)
MCV: 84 fL (ref 79–97)
Monocytes Absolute: 0.4 10*3/uL (ref 0.1–0.9)
Monocytes: 8 %
Neutrophils Absolute: 2.2 10*3/uL (ref 1.4–7.0)
Neutrophils: 44 %
Platelets: 230 10*3/uL (ref 150–450)
RBC: 4.51 x10E6/uL (ref 3.77–5.28)
RDW: 12.4 % (ref 11.7–15.4)
WBC: 5.1 10*3/uL (ref 3.4–10.8)

## 2020-05-30 LAB — IRON,TIBC AND FERRITIN PANEL
Ferritin: 9 ng/mL — ABNORMAL LOW (ref 15–150)
Iron Saturation: 10 % — ABNORMAL LOW (ref 15–55)
Iron: 38 ug/dL (ref 27–159)
Total Iron Binding Capacity: 376 ug/dL (ref 250–450)
UIBC: 338 ug/dL (ref 131–425)

## 2020-07-03 ENCOUNTER — Ambulatory Visit: Payer: Medicaid Other | Admitting: Nurse Practitioner

## 2020-11-26 ENCOUNTER — Ambulatory Visit (INDEPENDENT_AMBULATORY_CARE_PROVIDER_SITE_OTHER): Payer: Medicaid Other | Admitting: Nurse Practitioner

## 2020-11-26 ENCOUNTER — Other Ambulatory Visit: Payer: Self-pay

## 2020-11-26 ENCOUNTER — Encounter: Payer: Self-pay | Admitting: Nurse Practitioner

## 2020-11-26 VITALS — BP 113/81 | HR 90 | Temp 97.2°F | Ht 65.0 in | Wt 196.8 lb

## 2020-11-26 DIAGNOSIS — K2100 Gastro-esophageal reflux disease with esophagitis, without bleeding: Secondary | ICD-10-CM

## 2020-11-26 DIAGNOSIS — Z13220 Encounter for screening for lipoid disorders: Secondary | ICD-10-CM

## 2020-11-26 DIAGNOSIS — E221 Hyperprolactinemia: Secondary | ICD-10-CM

## 2020-11-26 DIAGNOSIS — D509 Iron deficiency anemia, unspecified: Secondary | ICD-10-CM | POA: Diagnosis not present

## 2020-11-26 DIAGNOSIS — R1084 Generalized abdominal pain: Secondary | ICD-10-CM

## 2020-11-26 DIAGNOSIS — K5909 Other constipation: Secondary | ICD-10-CM

## 2020-11-26 DIAGNOSIS — K649 Unspecified hemorrhoids: Secondary | ICD-10-CM

## 2020-11-26 LAB — POCT URINALYSIS DIPSTICK
Bilirubin, UA: NEGATIVE
Blood, UA: NEGATIVE
Glucose, UA: NEGATIVE
Ketones, UA: NEGATIVE
Leukocytes, UA: NEGATIVE
Nitrite, UA: NEGATIVE
Protein, UA: NEGATIVE
Spec Grav, UA: 1.02 (ref 1.010–1.025)
Urobilinogen, UA: 0.2 E.U./dL
pH, UA: 7 (ref 5.0–8.0)

## 2020-11-26 MED ORDER — HYDROCORTISONE (PERIANAL) 2.5 % EX CREA: 1.0000 "application " | TOPICAL_CREAM | Freq: Two times a day (BID) | CUTANEOUS | 2 refills | Status: AC

## 2020-11-26 NOTE — Progress Notes (Signed)
[place], [place]  [address] Phone:  [phone]   Fax:  [phone]   Established Patient Office Visit  Subjective:  Patient ID: Kirsten Torres, female    DOB: Sep 04, 1971  Age: 50 y.o. MRN: [id number]  CC:  Chief Complaint  Patient presents with  . Follow-up    6 month follow up , needs  refill on meds , Hemorid flare up about 2 weeks , need a cream to help.     HPI Kirsten Torres presents for follow up. She  has a past medical history of Genital mutilation, female, GERD (gastroesophageal reflux disease), and SVD (spontaneous vaginal delivery) (05/28/2011).   Hemorrhoids Patient presents for evaluation of possible hemorrhoids. Onset of symptoms was gradual. Symptoms have been unchanged since that time. Symptoms include: bleeding which only occurs with bowel movements. Patient denies family hx of colorectal CA, history of previous STDs, maroon colored stools, melena and receptive anal intercourse. Treatment to date has been triamcinolone She is not effecitvely treating the constipation. She only takes the [place] when she is constipated.   Past Medical History:  Diagnosis Date  . Genital mutilation, female   . GERD (gastroesophageal reflux disease)   . SVD (spontaneous vaginal delivery) 05/28/2011    Past Surgical History:  Procedure Laterality Date  . genital mutilation    . TONSILLECTOMY  2004    Family History  Problem Relation Age of Onset  . Colon cancer Neg Hx   . Colon polyps Neg Hx     Social History   Socioeconomic History  . Marital status: Married    Spouse name: Not on file  . Number of children: 2  . Years of education: master's  . Highest education level: Not on file  Occupational History  . Occupation: homemaker  Tobacco Use  . Smoking status: Never Smoker  . Smokeless tobacco: Never Used  Vaping Use  . Vaping Use: Never used  Substance and Sexual Activity  . Alcohol use: No  . Drug use: No   . Sexual activity: Yes    Partners: Male    Birth control/protection: None    Comment: stopped COC to become pregnant  Other Topics Concern  . Not on file  Social History Narrative   From [place]. Came to the [place] in 2011. Master's Degree in [employer].   Lives with her husband (came here from [place] in 2004) and their 2 children.   Social Determinants of Health   Financial Resource Strain: Not on file  Food Insecurity: Not on file  Transportation Needs: Not on file  Physical Activity: Not on file  Stress: Not on file  Social Connections: Not on file  Intimate Partner Violence: Not on file    Outpatient Medications Prior to Visit  Medication Sig Dispense Refill  . DHEA 50 MG TABS Take by mouth.    . linaclotide (LINZESS) 145 MCG CAPS capsule Take 1 capsule (145 mcg total) by mouth daily before breakfast. 90 capsule 3  . Naproxen Sodium (ALEVE PO) Take by mouth.    Marland Kitchen omeprazole (PRILOSEC) 20 MG capsule Take 1 capsule (20 mg total) by mouth daily. Will need to schedule appointment for refills 90 capsule 3  . triamcinolone cream (KENALOG) 0.1 % Apply 1 application topically 2 (two) times daily. 180 g 3  . cabergoline (DOSTINEX) 0.5 MG tablet Take 0.25 mg by mouth 2 (two) times a week.     No facility-administered medications prior  to visit.    Allergies  Allergen Reactions  . Iron     Had reaction to iron infusion     ROS Review of Systems  Constitutional: Negative.   HENT: Negative.   Respiratory: Negative for shortness of breath.   Cardiovascular: Negative for chest pain.  Gastrointestinal: Positive for constipation. Negative for nausea and vomiting.  Endocrine: Negative.   Genitourinary: Positive for difficulty urinating (feeling related to consitpation). Negative for menstrual problem.  Musculoskeletal: Negative.   Skin: Negative.   Hematological: Negative.       Objective:    Physical Exam HENT:     Head: Normocephalic and atraumatic.     Nose: Nose normal.      Mouth/Throat:     Mouth: Mucous membranes are moist.  Cardiovascular:     Rate and Rhythm: Normal rate and regular rhythm.     Pulses: Normal pulses.     Heart sounds: Normal heart sounds.  Pulmonary:     Effort: Pulmonary effort is normal.     Breath sounds: Normal breath sounds.  Abdominal:     General: Bowel sounds are normal.     Tenderness: There is abdominal tenderness (generalized).  Musculoskeletal:        General: Normal range of motion.     Cervical back: Normal range of motion.  Skin:    General: Skin is warm and dry.     Capillary Refill: Capillary refill takes less than 2 seconds.  Neurological:     General: No focal deficit present.     Mental Status: She is alert and oriented to person, place, and time.  Psychiatric:        Mood and Affect: Mood normal.        Behavior: Behavior normal.        Thought Content: Thought content normal.        Judgment: Judgment normal.     BP 113/81 (BP Location: Left Arm, Patient Position: Sitting, Cuff Size: Normal)   Pulse 90   Temp (!) 97.2 F (36.2 C) (Temporal)   Ht 5\' 5"  (1.651 m)   Wt 196 lb 12.8 oz (89.3 kg)   BMI 32.75 kg/m  Wt Readings from Last 3 Encounters:  11/26/20 196 lb 12.8 oz (89.3 kg)  05/29/20 196 lb 3.2 oz (89 kg)  01/24/20 187 lb (84.8 kg)     Health Maintenance Due  Topic Date Due  . PAP SMEAR-Modifier  06/02/2020    There are no preventive care reminders to display for this patient.  Lab Results  Component Value Date   TSH 0.911 01/03/2020   Lab Results  Component Value Date   WBC 5.6 11/26/2020   HGB 12.5 11/26/2020   HCT 37.6 11/26/2020   MCV 83 11/26/2020   PLT 251 11/26/2020   Lab Results  Component Value Date   NA 140 11/26/2020   K 3.9 11/26/2020   CO2 22 10/19/2018   GLUCOSE 94 11/26/2020   BUN 10 11/26/2020   CREATININE 0.71 11/26/2020   BILITOT <0.2 11/26/2020   ALKPHOS 71 11/26/2020   AST 16 11/26/2020   ALT 6 10/19/2018   PROT 7.1 11/26/2020   ALBUMIN 4.2  11/26/2020   CALCIUM 9.1 11/26/2020   Lab Results  Component Value Date   CHOL 148 11/26/2020   Lab Results  Component Value Date   HDL 50 11/26/2020   Lab Results  Component Value Date   LDLCALC 82 11/26/2020   Lab Results  Component Value  Date   TRIG 85 11/26/2020   Lab Results  Component Value Date   CHOLHDL 3.0 11/26/2020   Lab Results  Component Value Date   HGBA1C 5.3 11/16/2015      Assessment & Plan:   Problem List Items Addressed This Visit      Digestive   Chronic constipation - Primary Persistent continue with Linzess 145 mg daily     Endocrine   Hyperprolactinemia ([place])   Relevant Orders   POCT Urinalysis Dipstick (Completed)     Other   Generalized abdominal pain Persistent labs pending   Relevant Orders   Comp. Metabolic Panel (12) (Completed)   POCT Urinalysis Dipstick (Completed)    Other Visit Diagnoses    Gastroesophageal reflux disease with esophagitis without hemorrhage       Iron deficiency anemia, unspecified iron deficiency anemia type       Relevant Orders   CBC with Differential/Platelet (Completed)   Screening for cholesterol level       Relevant Orders   Lipid panel (Completed)  Hemorrhoids, unspecified hemorrhoid type Persistent long discussion about prevention of constipation Hydrocortisone 2.5% rectal cream to be used for hemorrhoid flares     Meds ordered this encounter  Medications  . hydrocortisone (PROCTOSOL HC) 2.5 % rectal cream    Sig: Place 1 application rectally 2 (two) times daily for 10 days.    Dispense:  30 g    Refill:  2    Order Specific Question:   Supervising Provider    Answer:   Tresa Garter [id number]    Follow-up: Return in about 2 months (around 01/26/2021) for Physcial VISIT,EST,40-64 [[id number]].    Vevelyn Francois, NP

## 2020-11-26 NOTE — Patient Instructions (Addendum)
Health Maintenance, Female Adopting a healthy lifestyle and getting preventive care are important in promoting health and wellness. Ask your health care provider about:  The right schedule for you to have regular tests and exams.  Things you can do on your own to prevent diseases and keep yourself healthy. What should I know about diet, weight, and exercise? Eat a healthy diet  Eat a diet that includes plenty of vegetables, fruits, low-fat dairy products, and lean protein.  Do not eat a lot of foods that are high in solid fats, added sugars, or sodium.   Maintain a healthy weight Body mass index (BMI) is used to identify weight problems. It estimates body fat based on height and weight. Your health care provider can help determine your BMI and help you achieve or maintain a healthy weight. Get regular exercise Get regular exercise. This is one of the most important things you can do for your health. Most adults should:  Exercise for at least 150 minutes each week. The exercise should increase your heart rate and make you sweat (moderate-intensity exercise).  Do strengthening exercises at least twice a week. This is in addition to the moderate-intensity exercise.  Spend less time sitting. Even light physical activity can be beneficial. Watch cholesterol and blood lipids Have your blood tested for lipids and cholesterol at 48 years of age, then have this test every 5 years. Have your cholesterol levels checked more often if:  Your lipid or cholesterol levels are high.  You are older than 48 years of age.  You are at high risk for heart disease. What should I know about cancer screening? Depending on your health history and family history, you may need to have cancer screening at various ages. This may include screening for:  Breast cancer.  Cervical cancer.  Colorectal cancer.  Skin cancer.  Lung cancer. What should I know about heart disease, diabetes, and high blood  pressure? Blood pressure and heart disease  High blood pressure causes heart disease and increases the risk of stroke. This is more likely to develop in people who have high blood pressure readings, are of African descent, or are overweight.  Have your blood pressure checked: ? Every 3-5 years if you are 18-39 years of age. ? Every year if you are 40 years old or older. Diabetes Have regular diabetes screenings. This checks your fasting blood sugar level. Have the screening done:  Once every three years after age 40 if you are at a normal weight and have a low risk for diabetes.  More often and at a younger age if you are overweight or have a high risk for diabetes. What should I know about preventing infection? Hepatitis B If you have a higher risk for hepatitis B, you should be screened for this virus. Talk with your health care provider to find out if you are at risk for hepatitis B infection. Hepatitis C Testing is recommended for:  Everyone born from 1945 through 1965.  Anyone with known risk factors for hepatitis C. Sexually transmitted infections (STIs)  Get screened for STIs, including gonorrhea and chlamydia, if: ? You are sexually active and are younger than 48 years of age. ? You are older than 48 years of age and your health care provider tells you that you are at risk for this type of infection. ? Your sexual activity has changed since you were last screened, and you are at increased risk for chlamydia or gonorrhea. Ask your health care provider   if you are at risk.  Ask your health care provider about whether you are at high risk for HIV. Your health care provider may recommend a prescription medicine to help prevent HIV infection. If you choose to take medicine to prevent HIV, you should first get tested for HIV. You should then be tested every 3 months for as long as you are taking the medicine. Pregnancy  If you are about to stop having your period (premenopausal) and  you may become pregnant, seek counseling before you get pregnant.  Take 400 to 800 micrograms (mcg) of folic acid every day if you become pregnant.  Ask for birth control (contraception) if you want to prevent pregnancy. Osteoporosis and menopause Osteoporosis is a disease in which the bones lose minerals and strength with aging. This can result in bone fractures. If you are 65 years old or older, or if you are at risk for osteoporosis and fractures, ask your health care provider if you should:  Be screened for bone loss.  Take a calcium or vitamin D supplement to lower your risk of fractures.  Be given hormone replacement therapy (HRT) to treat symptoms of menopause. Follow these instructions at home: Lifestyle  Do not use any products that contain nicotine or tobacco, such as cigarettes, e-cigarettes, and chewing tobacco. If you need help quitting, ask your health care provider.  Do not use street drugs.  Do not share needles.  Ask your health care provider for help if you need support or information about quitting drugs. Alcohol use  Do not drink alcohol if: ? Your health care provider tells you not to drink. ? You are pregnant, may be pregnant, or are planning to become pregnant.  If you drink alcohol: ? Limit how much you use to 0-1 drink a day. ? Limit intake if you are breastfeeding.  Be aware of how much alcohol is in your drink. In the [place] one drink equals one 12 oz bottle of beer (355 mL), one 5 oz glass of wine (148 mL), or one 1 oz glass of hard liquor (44 mL). General instructions  Schedule regular health, dental, and eye exams.  Stay current with your vaccines.  Tell your health care provider if: ? You often feel depressed. ? You have ever been abused or do not feel safe at home. Summary  Adopting a healthy lifestyle and getting preventive care are important in promoting health and wellness.  Follow your health care provider's instructions about healthy  diet, exercising, and getting tested or screened for diseases.  Follow your health care provider's instructions on monitoring your cholesterol and blood pressure. This information is not intended to replace advice given to you by your health care provider. Make sure you discuss any questions you have with your health care provider. Document Revised: 08/22/2018 Document Reviewed: 08/22/2018 Elsevier Patient Education  2021 Elsevier Inc.  

## 2020-11-27 LAB — CBC WITH DIFFERENTIAL/PLATELET
Basophils Absolute: 0 10*3/uL (ref 0.0–0.2)
Basos: 0 %
EOS (ABSOLUTE): 0.1 10*3/uL (ref 0.0–0.4)
Eos: 1 %
Hematocrit: 37.6 % (ref 34.0–46.6)
Hemoglobin: 12.5 g/dL (ref 11.1–15.9)
Immature Grans (Abs): 0 10*3/uL (ref 0.0–0.1)
Immature Granulocytes: 0 %
Lymphocytes Absolute: 2.4 10*3/uL (ref 0.7–3.1)
Lymphs: 43 %
MCH: 27.7 pg (ref 26.6–33.0)
MCHC: 33.2 g/dL (ref 31.5–35.7)
MCV: 83 fL (ref 79–97)
Monocytes Absolute: 0.4 10*3/uL (ref 0.1–0.9)
Monocytes: 6 %
Neutrophils Absolute: 2.8 10*3/uL (ref 1.4–7.0)
Neutrophils: 50 %
Platelets: 251 10*3/uL (ref 150–450)
RBC: 4.52 x10E6/uL (ref 3.77–5.28)
RDW: 12.5 % (ref 11.7–15.4)
WBC: 5.6 10*3/uL (ref 3.4–10.8)

## 2020-11-27 LAB — LIPID PANEL
Chol/HDL Ratio: 3 ratio (ref 0.0–4.4)
Cholesterol, Total: 148 mg/dL (ref 100–199)
HDL: 50 mg/dL (ref >39)
LDL Chol Calc (NIH): 82 mg/dL (ref 0–99)
Triglycerides: 85 mg/dL (ref 0–149)
VLDL Cholesterol Cal: 16 mg/dL (ref 5–40)

## 2020-11-27 LAB — COMP. METABOLIC PANEL (12)
AST: 16 IU/L (ref 0–40)
Albumin/Globulin Ratio: 1.4 (ref 1.2–2.2)
Albumin: 4.2 g/dL (ref 3.8–4.8)
Alkaline Phosphatase: 71 IU/L (ref 44–121)
BUN/Creatinine Ratio: 14 (ref 9–23)
BUN: 10 mg/dL (ref 6–24)
Bilirubin Total: <0.2 mg/dL (ref 0.0–1.2)
Calcium: 9.1 mg/dL (ref 8.7–10.2)
Chloride: 103 mmol/L (ref 96–106)
Creatinine, Ser: 0.71 mg/dL (ref 0.57–1.00)
Globulin, Total: 2.9 g/dL (ref 1.5–4.5)
Glucose: 94 mg/dL (ref 65–99)
Potassium: 3.9 mmol/L (ref 3.5–5.2)
Sodium: 140 mmol/L (ref 134–144)
Total Protein: 7.1 g/dL (ref 6.0–8.5)
eGFR: 105 mL/min/{1.73_m2} (ref >59)

## 2020-11-30 ENCOUNTER — Telehealth: Payer: Self-pay

## 2020-11-30 NOTE — Telephone Encounter (Signed)
Interpreter ID# [id number] called and left patient message that labs are normal.

## 2020-11-30 NOTE — Telephone Encounter (Signed)
-----   Message from Vevelyn Francois, NP sent at 11/30/2020 10:16 AM EDT ----- Please make her aware of all of her labs are within normal range.  It indicates she has my chart but I do not think she has looked at her values thank you.

## 2021-01-28 ENCOUNTER — Ambulatory Visit: Payer: Medicaid Other | Admitting: Nurse Practitioner

## 2021-03-25 ENCOUNTER — Encounter: Payer: Medicaid Other | Admitting: Nurse Practitioner

## 2021-06-07 ENCOUNTER — Encounter: Payer: Medicaid Other | Admitting: Nurse Practitioner

## 2021-07-01 ENCOUNTER — Ambulatory Visit (INDEPENDENT_AMBULATORY_CARE_PROVIDER_SITE_OTHER): Payer: Medicaid Other | Admitting: Nurse Practitioner

## 2021-07-01 ENCOUNTER — Encounter: Payer: Self-pay | Admitting: Nurse Practitioner

## 2021-07-01 ENCOUNTER — Other Ambulatory Visit: Payer: Self-pay

## 2021-07-01 VITALS — BP 122/80 | HR 79 | Temp 97.4°F | Ht 65.0 in | Wt 191.0 lb

## 2021-07-01 DIAGNOSIS — K2100 Gastro-esophageal reflux disease with esophagitis, without bleeding: Secondary | ICD-10-CM | POA: Diagnosis not present

## 2021-07-01 DIAGNOSIS — Z0000 Encounter for general adult medical examination without abnormal findings: Secondary | ICD-10-CM

## 2021-07-01 DIAGNOSIS — Z01419 Encounter for gynecological examination (general) (routine) without abnormal findings: Secondary | ICD-10-CM

## 2021-07-01 DIAGNOSIS — Z1239 Encounter for other screening for malignant neoplasm of breast: Secondary | ICD-10-CM

## 2021-07-01 DIAGNOSIS — K5909 Other constipation: Secondary | ICD-10-CM

## 2021-07-01 LAB — POCT URINALYSIS DIP (CLINITEK)
Bilirubin, UA: NEGATIVE
Blood, UA: NEGATIVE
Glucose, UA: NEGATIVE mg/dL
Ketones, POC UA: NEGATIVE mg/dL
Leukocytes, UA: NEGATIVE
Nitrite, UA: NEGATIVE
POC PROTEIN,UA: NEGATIVE
Spec Grav, UA: 1.015 (ref 1.010–1.025)
Urobilinogen, UA: 0.2 E.U./dL
pH, UA: 7 (ref 5.0–8.0)

## 2021-07-01 MED ORDER — LINACLOTIDE 145 MCG PO CAPS: 145.0000 ug | ORAL_CAPSULE | Freq: Every day | ORAL | 3 refills | Status: DC

## 2021-07-01 MED ORDER — HYDROCORTISONE (PERIANAL) 2.5 % EX CREA: 1.0000 "application " | TOPICAL_CREAM | Freq: Two times a day (BID) | CUTANEOUS | 5 refills | Status: DC

## 2021-07-01 MED ORDER — OMEPRAZOLE 20 MG PO CPDR: 20.0000 mg | DELAYED_RELEASE_CAPSULE | Freq: Every day | ORAL | 3 refills | Status: DC

## 2021-07-01 NOTE — Progress Notes (Signed)
[place], [place]  [address] Phone:  [phone]   Fax:  [phone]   Established Patient Office Visit  Subjective:  Patient ID: Kirsten Torres, female    DOB: February 17, 1971  Age: 50 y.o. MRN: [id number]  CC:  Chief Complaint  Patient presents with   Gynecologic Exam    PAP.   Hemorrhoids    Has used cortisone cream.     HPI Kirsten Torres presents for annual exam. She  has a past medical history of Genital mutilation, female, GERD (gastroesophageal reflux disease), and SVD (spontaneous vaginal delivery) (05/28/2011).    Past Medical History:  Diagnosis Date   Genital mutilation, female    GERD (gastroesophageal reflux disease)    SVD (spontaneous vaginal delivery) 05/28/2011    Past Surgical History:  Procedure Laterality Date   genital mutilation     TONSILLECTOMY  2004    Family History  Problem Relation Age of Onset   Colon cancer Neg Hx    Colon polyps Neg Hx     Social History   Socioeconomic History   Marital status: Married    Spouse name: Not on file   Number of children: 2   Years of education: master's   Highest education level: Not on file  Occupational History   Occupation: homemaker  Tobacco Use   Smoking status: Never   Smokeless tobacco: Never  Vaping Use   Vaping Use: Never used  Substance and Sexual Activity   Alcohol use: No   Drug use: No   Sexual activity: Yes    Partners: Male    Birth control/protection: None    Comment: stopped COC to become pregnant  Other Topics Concern   Not on file  Social History Narrative   From [place]. Came to the [place] in 2011. Master's Degree in [employer].   Lives with her husband (came here from [place] in 2004) and their 2 children.   Social Determinants of Health   Financial Resource Strain: Not on file  Food Insecurity: Not on file  Transportation Needs: Not on file  Physical Activity: Not on file  Stress: Not on file  Social Connections: Not  on file  Intimate Partner Violence: Not on file    Outpatient Medications Prior to Visit  Medication Sig Dispense Refill   DHEA 50 MG TABS Take by mouth.     Naproxen Sodium (ALEVE PO) Take by mouth.     cabergoline (DOSTINEX) 0.5 MG tablet Take 0.25 mg by mouth 2 (two) times a week. (Patient not taking: Reported on 07/01/2021)     linaclotide (LINZESS) 145 MCG CAPS capsule Take 1 capsule (145 mcg total) by mouth daily before breakfast. 90 capsule 3   omeprazole (PRILOSEC) 20 MG capsule Take 1 capsule (20 mg total) by mouth daily. Will need to schedule appointment for refills 90 capsule 3   No facility-administered medications prior to visit.    Allergies  Allergen Reactions   Iron     Had reaction to iron infusion     ROS Review of Systems  Gastrointestinal:  Positive for constipation.       Hemorrhoids occasional small amt of bleeding     Objective:    Physical Exam Exam conducted with a chaperone present.  Constitutional:      General: She is not in acute distress.    Appearance: She is obese. She is not ill-appearing, toxic-appearing or diaphoretic.  Chest:  Breasts:  Right: Normal.     Left: Normal.  Abdominal:     Hernia: There is no hernia in the left inguinal area or right inguinal area.  Genitourinary:    Labia:        Right: No rash, tenderness, lesion or injury.        Left: No rash, tenderness, lesion or injury.      Urethra: No prolapse, urethral pain, urethral swelling or urethral lesion.     Vagina: Normal.     Rectum: External hemorrhoid (no inflammation) present.     Comments: Visible extra layer of skin between 6 -8 o'clock scant amount of bleeding to cervix on exam Lymphadenopathy:     Upper Body:     Right upper body: No supraclavicular, axillary or pectoral adenopathy.     Left upper body: No supraclavicular, axillary or pectoral adenopathy.     Lower Body: No right inguinal adenopathy. No left inguinal adenopathy.  Neurological:     Mental  Status: She is alert.   BP 122/80   Pulse 79   Temp (!) 97.4 F (36.3 C)   Ht _0  (1.651 m)   Wt 191 lb 0.2 oz (86.6 kg)   LMP 06/18/2021   SpO2 100%   BMI 31.79 kg/m  Wt Readings from Last 3 Encounters:  07/01/21 191 lb 0.2 oz (86.6 kg)  11/26/20 196 lb 12.8 oz (89.3 kg)  05/29/20 196 lb 3.2 oz (89 kg)     Health Maintenance Due  Topic Date Due   COVID-19 Vaccine (1) Never done   Hepatitis C Screening  Never done   PAP SMEAR-Modifier  06/02/2020   TETANUS/TDAP  05/28/2021    There are no preventive care reminders to display for this patient.  Lab Results  Component Value Date   TSH 0.911 01/03/2020   Lab Results  Component Value Date   WBC 5.6 11/26/2020   HGB 12.5 11/26/2020   HCT 37.6 11/26/2020   MCV 83 11/26/2020   PLT 251 11/26/2020   Lab Results  Component Value Date   NA 140 11/26/2020   K 3.9 11/26/2020   CO2 22 10/19/2018   GLUCOSE 94 11/26/2020   BUN 10 11/26/2020   CREATININE 0.71 11/26/2020   BILITOT <0.2 11/26/2020   ALKPHOS 71 11/26/2020   AST 16 11/26/2020   ALT 6 10/19/2018   PROT 7.1 11/26/2020   ALBUMIN 4.2 11/26/2020   CALCIUM 9.1 11/26/2020   EGFR 105 11/26/2020   Lab Results  Component Value Date   CHOL 148 11/26/2020   Lab Results  Component Value Date   HDL 50 11/26/2020   Lab Results  Component Value Date   LDLCALC 82 11/26/2020   Lab Results  Component Value Date   TRIG 85 11/26/2020   Lab Results  Component Value Date   CHOLHDL 3.0 11/26/2020   Lab Results  Component Value Date   HGBA1C 5.3 11/16/2015      Assessment & Plan:   Problem List Items Addressed This Visit       Digestive   Chronic constipation Encouraged stool softeners one to two times per day based on what was discussed Encouraged Miralax QOD to daily based on need and dicussion Encouraged hydration with water at least 8-10 8 ounce glasses per day Eat foods that have a lot of fiber, such as: Fresh fruits and vegetables. Whole  grains. Beans. Eat less of foods that are high in fat, low in fiber, or overly processed Encouraged regular  daily exercise starting with walking 20 minutes per day    Relevant Medications   linaclotide (LINZESS) 145 MCG CAPS capsule   Other Visit Diagnoses     Healthcare maintenance    -  Primary   Relevant Orders   POCT URINALYSIS DIP (CLINITEK)   Gastroesophageal reflux disease with esophagitis without hemorrhage       refill on Omeprazole    Relevant Medications   omeprazole (PRILOSEC) 20 MG capsule   Encounter for annual routine gynecological examination       Relevant Orders   IGP, rfx Aptima HPV ASCU   Breast screening           Meds ordered this encounter  Medications   linaclotide (LINZESS) 145 MCG CAPS capsule    Sig: Take 1 capsule (145 mcg total) by mouth daily before breakfast.    Dispense:  90 capsule    Refill:  3   omeprazole (PRILOSEC) 20 MG capsule    Sig: Take 1 capsule (20 mg total) by mouth daily. Will need to schedule appointment for refills    Dispense:  90 capsule    Refill:  3    Follow-up: No follow-ups on file.    Vevelyn Francois, NP

## 2021-07-01 NOTE — Patient Instructions (Addendum)
[facility] [phone]  Breast Self-Awareness Breast self-awareness is knowing how your breasts look and feel. Doing breast self-awareness is important. It allows you to catch a breast problem early while it is still small and can be treated. All women should do breast self-awareness, including women who have had breast implants. Tell your doctor if you notice a change in your breasts. What you need: A mirror. A well-lit room. How to do a breast self-exam A breast self-exam is one way to learn what is normal for your breasts and to check for changes. To do a breast self-exam: Look for changes  Take off all the clothes above your waist. Stand in front of a mirror in a room with good lighting. Put your hands on your hips. Push your hands down. Look at your breasts and nipples in the mirror to see if one breast or nipple looks different from the other. Check to see if: The shape of one breast is different. The size of one breast is different. There are wrinkles, dips, and bumps in one breast and not the other. Look at each breast for changes in the skin, such as: Redness. Scaly areas. Look for changes in your nipples, such as: Liquid around the nipples. Bleeding. Dimpling. Redness. A change in where the nipples are. Feel for changes  Lie on your back on the floor. Feel each breast. To do this, follow these steps: Pick a breast to feel. Put the arm closest to that breast above your head. Use your other arm to feel the nipple area of your breast. Feel the area with the pads of your three middle fingers by making small circles with your fingers. For the first circle, press lightly. For the second circle, press harder. For the third circle, press even harder. Keep making circles with your fingers at the different pressures as you move down your breast. Stop when you feel your ribs. Move your fingers a little toward the center of your body. Start making circles with your fingers  again, this time going up until you reach your collarbone. Keep making up-and-down circles until you reach your armpit. Remember to keep using the three pressures. Feel the other breast in the same way. Sit or stand in the tub or shower. With soapy water on your skin, feel each breast the same way you did in step 2 when you were lying on the floor. Write down what you find Writing down what you find can help you remember what to tell your doctor. Write down: What is normal for each breast. Any changes you find in each breast, including: The kind of changes you find. Whether you have pain. Size and location of any lumps. When you last had your menstrual period. General tips Check your breasts every month. If you are breastfeeding, the best time to check your breasts is after you feed your baby or after you use a breast pump. If you get menstrual periods, the best time to check your breasts is 5-7 days after your menstrual period is over. With time, you will become comfortable with the self-exam, and you will begin to know if there are changes in your breasts. Contact a doctor if you: See a change in the shape or size of your breasts or nipples. See a change in the skin of your breast or nipples, such as red or scaly skin. Have fluid coming from your nipples that is not normal. Find a lump or thick area that was not  there before. Have pain in your breasts. Have any concerns about your breast health. Summary Breast self-awareness includes looking for changes in your breasts, as well as feeling for changes within your breasts. Breast self-awareness should be done in front of a mirror in a well-lit room. You should check your breasts every month. If you get menstrual periods, the best time to check your breasts is 5-7 days after your menstrual period is over. Let your doctor know of any changes you see in your breasts, including changes in size, changes on the skin, pain or tenderness, or  fluid from your nipples that is not normal. This information is not intended to replace advice given to you by your health care provider. Make sure you discuss any questions you have with your health care provider. Document Revised: 04/17/2018 Document Reviewed: 04/17/2018 Elsevier Patient Education  [place].  Colonoscopy, Adult A colonoscopy is a procedure to look at the entire large intestine. This procedure is done using a long, thin, flexible tube that has a camera on the end. You may have a colonoscopy: As a part of normal colorectal screening. If you have certain symptoms, such as: A low number of red blood cells in your blood (anemia). Diarrhea that does not go away. Pain in your abdomen. Blood in your stool. A colonoscopy can help screen for and diagnose medical problems, including: Tumors. Extra tissue that grows where mucus forms (polyps). Inflammation. Areas of bleeding. Tell your health care provider about: Any allergies you have. All medicines you are taking, including vitamins, herbs, eye drops, creams, and over-the-counter medicines. Any problems you or family members have had with anesthetic medicines. Any blood disorders you have. Any surgeries you have had. Any medical conditions you have. Any problems you have had with having bowel movements. Whether you are pregnant or may be pregnant. What are the risks? Generally, this is a safe procedure. However, problems may occur, including: Bleeding. Damage to your intestine. Allergic reactions to medicines given during the procedure. Infection. This is rare. What happens before the procedure? Eating and drinking restrictions Follow instructions from your health care provider about eating or drinking restrictions, which may include: A few days before the procedure: Follow a low-fiber diet. Avoid nuts, seeds, dried fruit, raw fruits, and vegetables. 1-3 days before the procedure: Eat only gelatin dessert  or ice pops. Drink only clear liquids, such as water, clear juice, clear broth or bouillon, black coffee or tea, or clear soft drinks or sports drinks. Avoid liquids that contain red or purple dye. The day of the procedure: Do not eat solid foods. You may continue to drink clear liquids until up to 2 hours before the procedure. Do not eat or drink anything starting 2 hours before the procedure, or within the time period that your health care provider recommends. Bowel prep If you were prescribed a bowel prep to take by mouth (orally) to clean out your colon: Take it as told by your health care provider. Starting the day before your procedure, you will need to drink a large amount of liquid medicine. The liquid will cause you to have many bowel movements of loose stool until your stool becomes almost clear or light green. If your skin or the opening between the buttocks (anus) gets irritated from diarrhea, you may relieve the irritation using: Wipes with medicine in them, such as adult wet wipes with aloe and vitamin E. A product to soothe skin, such as petroleum jelly. If you vomit while  drinking the bowel prep: Take a break for up to 60 minutes. Begin the bowel prep again. Call your health care provider if you keep vomiting or you cannot take the bowel prep without vomiting. To clean out your colon, you may also be given: Laxative medicines. These help you have a bowel movement. Instructions for enema use. An enema is liquid medicine injected into your rectum. Medicines Ask your health care provider about: Changing or stopping your regular medicines or supplements. This is especially important if you are taking iron supplements, diabetes medicines, or blood thinners. Taking medicines such as aspirin and ibuprofen. These medicines can thin your blood. Do not take these medicines unless your health care provider tells you to take them. Taking over-the-counter medicines, vitamins, herbs, and  supplements. General instructions Ask your health care provider what steps will be taken to help prevent infection. These may include washing skin with a germ-killing soap. Plan to have someone take you home from the hospital or clinic. What happens during the procedure?  An IV will be inserted into one of your veins. You may be given one or more of the following: A medicine to help you relax (sedative). A medicine to numb the area (local anesthetic). A medicine to make you fall asleep (general anesthetic). This is rarely needed. You will lie on your side with your knees bent. The tube will: Have oil or gel put on it (be lubricated). Be inserted into your anus. Be gently eased through all parts of your large intestine. Air will be sent into your colon to keep it open. This may cause some pressure or cramping. Images will be taken with the camera and will appear on a screen. A small tissue sample may be removed to be looked at under a microscope (biopsy). The tissue may be sent to a lab for testing if any signs of problems are found. If small polyps are found, they may be removed and checked for cancer cells. When the procedure is finished, the tube will be removed. The procedure may vary among health care providers and hospitals. What happens after the procedure? Your blood pressure, heart rate, breathing rate, and blood oxygen level will be monitored until you leave the hospital or clinic. You may have a small amount of blood in your stool. You may pass gas and have mild cramping or bloating in your abdomen. This is caused by the air that was used to open your colon during the exam. Do not drive for 24 hours after the procedure. It is up to you to get the results of your procedure. Ask your health care provider, or the department that is doing the procedure, when your results will be ready. Summary A colonoscopy is a procedure to look at the entire large intestine. Follow instructions  from your health care provider about eating and drinking before the procedure. If you were prescribed an oral bowel prep to clean out your colon, take it as told by your health care provider. During the colonoscopy, a flexible tube with a camera on its end is inserted into the anus and then passed into the other parts of the large intestine. This information is not intended to replace advice given to you by your health care provider. Make sure you discuss any questions you have with your health care provider. Document Revised: 03/22/2019 Document Reviewed: 03/22/2019 Elsevier Patient Education  [place].

## 2021-07-08 LAB — IGP, RFX APTIMA HPV ASCU

## 2022-06-30 ENCOUNTER — Ambulatory Visit: Payer: Medicaid Other | Admitting: Nurse Practitioner

## 2023-03-07 ENCOUNTER — Other Ambulatory Visit: Payer: Self-pay | Admitting: Nurse Practitioner

## 2023-03-07 DIAGNOSIS — K2100 Gastro-esophageal reflux disease with esophagitis, without bleeding: Secondary | ICD-10-CM

## 2023-06-01 ENCOUNTER — Ambulatory Visit (INDEPENDENT_AMBULATORY_CARE_PROVIDER_SITE_OTHER): Payer: Medicaid Other | Admitting: Nurse Practitioner

## 2023-06-01 ENCOUNTER — Encounter: Payer: Self-pay | Admitting: Nurse Practitioner

## 2023-06-01 VITALS — BP 108/69 | HR 86 | Temp 98.7°F | Wt 202.2 lb

## 2023-06-01 DIAGNOSIS — K649 Unspecified hemorrhoids: Secondary | ICD-10-CM | POA: Diagnosis not present

## 2023-06-01 DIAGNOSIS — Z1329 Encounter for screening for other suspected endocrine disorder: Secondary | ICD-10-CM | POA: Diagnosis not present

## 2023-06-01 DIAGNOSIS — Z13220 Encounter for screening for lipoid disorders: Secondary | ICD-10-CM | POA: Diagnosis not present

## 2023-06-01 DIAGNOSIS — Z8249 Family history of ischemic heart disease and other diseases of the circulatory system: Secondary | ICD-10-CM

## 2023-06-01 DIAGNOSIS — K5909 Other constipation: Secondary | ICD-10-CM | POA: Diagnosis not present

## 2023-06-01 DIAGNOSIS — K2100 Gastro-esophageal reflux disease with esophagitis, without bleeding: Secondary | ICD-10-CM | POA: Diagnosis not present

## 2023-06-01 DIAGNOSIS — Z0000 Encounter for general adult medical examination without abnormal findings: Secondary | ICD-10-CM

## 2023-06-01 DIAGNOSIS — I499 Cardiac arrhythmia, unspecified: Secondary | ICD-10-CM

## 2023-06-01 MED ORDER — HYDROCORTISONE (PERIANAL) 2.5 % EX CREA: 1.0000 | TOPICAL_CREAM | Freq: Two times a day (BID) | CUTANEOUS | 5 refills | Status: AC

## 2023-06-01 MED ORDER — OMEPRAZOLE 20 MG PO CPDR: 20.0000 mg | DELAYED_RELEASE_CAPSULE | Freq: Every day | ORAL | 3 refills | Status: DC

## 2023-06-01 MED ORDER — LINACLOTIDE 145 MCG PO CAPS: 145.0000 ug | ORAL_CAPSULE | Freq: Every day | ORAL | 3 refills | Status: AC

## 2023-06-01 NOTE — Patient Instructions (Signed)
1. Chronic constipation  - linaclotide (LINZESS) 145 MCG CAPS capsule; Take 1 capsule (145 mcg total) by mouth daily before breakfast.  Dispense: 90 capsule; Refill: 3 - Ambulatory referral to Gastroenterology  2. Gastroesophageal reflux disease with esophagitis without hemorrhage  - omeprazole (PRILOSEC) 20 MG capsule; Take 1 capsule (20 mg total) by mouth daily. Will need to schedule appointment for refills  Dispense: 90 capsule; Refill: 3 - Ambulatory referral to Gastroenterology  3. Hemorrhoids, unspecified hemorrhoid type  - Ambulatory referral to Gastroenterology  4. Routine adult health maintenance  - CBC - Comprehensive metabolic panel  5. Lipid screening  - Lipid Panel  6. Thyroid disorder screen  - TSH  7. Irregular heart rhythm  - EKG 12-Lead  Follow up:  Follow up in 1 year or sooner if needed

## 2023-06-01 NOTE — Progress Notes (Signed)
Subjective   Patient ID: Kirsten Torres, female    DOB: 19-Mar-1971, 52 y.o.   MRN: [id number]  Chief Complaint  Patient presents with   Annual Exam    fasting    Referring provider: No ref. provider found  Kirsten Torres is a 52 y.o. female with Past Medical History: No date: Genital mutilation, female No date: GERD (gastroesophageal reflux disease) 05/28/2011: SVD (spontaneous vaginal delivery)   HPI  Patient presents today for physical.  She states that overall she has been doing well other than chronic constipation and hemorrhoids.  We will place a referral for her to GI.  We will refill her medications for her.  She states that her last bowel movement was today but she did have a hard time using the bathroom.  Denies any blood in stools.  She is due for labs today. Denies f/c/s, n/v/d, hemoptysis, PND, leg swelling Denies chest pain or edema  Note: Upon exam patient's heart was noted to be out of rhythm.  EKG in office today showed NSR Patient denies any chest pain or palpitations. Patient states that she does have a strong family history of heart disease.  We will place referral for cardiology.  Allergies  Allergen Reactions   Iron     Had reaction to iron infusion     Immunization History  Administered Date(s) Administered   Tdap 05/29/2011    Tobacco History: Social History   Tobacco Use  Smoking Status Never  Smokeless Tobacco Never   Counseling given: Not Answered   Outpatient Encounter Medications as of 06/01/2023  Medication Sig   Naproxen Sodium (ALEVE PO) Take by mouth.   [DISCONTINUED] hydrocortisone (PROCTOSOL HC) 2.5 % rectal cream Place 1 application rectally 2 (two) times daily.   cabergoline (DOSTINEX) 0.5 MG tablet Take 0.25 mg by mouth 2 (two) times a week. (Patient not taking: Reported on 07/01/2021)   DHEA 50 MG TABS Take by mouth. (Patient not taking: Reported on 06/01/2023)   hydrocortisone (PROCTOSOL HC) 2.5 % rectal cream Place 1  Application rectally 2 (two) times daily.   linaclotide (LINZESS) 145 MCG CAPS capsule Take 1 capsule (145 mcg total) by mouth daily before breakfast.   omeprazole (PRILOSEC) 20 MG capsule Take 1 capsule (20 mg total) by mouth daily. Will need to schedule appointment for refills   [DISCONTINUED] linaclotide (LINZESS) 145 MCG CAPS capsule Take 1 capsule (145 mcg total) by mouth daily before breakfast. (Patient not taking: Reported on 06/01/2023)   [DISCONTINUED] omeprazole (PRILOSEC) 20 MG capsule Take 1 capsule (20 mg total) by mouth daily. Will need to schedule appointment for refills (Patient not taking: Reported on 06/01/2023)   No facility-administered encounter medications on file as of 06/01/2023.    Review of Systems  Review of Systems  Constitutional: Negative.   HENT: Negative.    Cardiovascular: Negative.   Gastrointestinal:  Positive for constipation.  Allergic/Immunologic: Negative.   Neurological: Negative.   Psychiatric/Behavioral: Negative.       Objective:   BP 108/69   Pulse 86   Temp 98.7 F (37.1 C)   Wt 202 lb 3.2 oz (91.7 kg)   SpO2 100%   BMI 33.65 kg/m   Wt Readings from Last 5 Encounters:  06/01/23 202 lb 3.2 oz (91.7 kg)  07/01/21 191 lb 0.2 oz (86.6 kg)  11/26/20 196 lb 12.8 oz (89.3 kg)  05/29/20 196 lb 3.2 oz (89 kg)  01/24/20 187 lb (84.8 kg)     Physical Exam  Vitals and nursing note reviewed.  Constitutional:      General: She is not in acute distress.    Appearance: She is well-developed.  Cardiovascular:     Rate and Rhythm: Normal rate. Rhythm irregular.  Pulmonary:     Effort: Pulmonary effort is normal.     Breath sounds: Normal breath sounds.  Neurological:     Mental Status: She is alert and oriented to person, place, and time.       Assessment & Plan:   Routine adult health maintenance -     CBC -     Comprehensive metabolic panel  Chronic constipation -     linaCLOtide; Take 1 capsule (145 mcg total) by mouth daily  before breakfast.  Dispense: 90 capsule; Refill: 3 -     Ambulatory referral to Gastroenterology  Gastroesophageal reflux disease with esophagitis without hemorrhage -     Omeprazole; Take 1 capsule (20 mg total) by mouth daily. Will need to schedule appointment for refills  Dispense: 90 capsule; Refill: 3 -     Ambulatory referral to Gastroenterology  Hemorrhoids, unspecified hemorrhoid type -     Ambulatory referral to Gastroenterology  Lipid screening -     Lipid panel  Thyroid disorder screen -     TSH  Irregular heart rhythm -     EKG 12-Lead -     Ambulatory referral to Cardiology  Family history of heart disease -     Ambulatory referral to Cardiology  Other orders -     Hydrocortisone (Perianal); Place 1 Application rectally 2 (two) times daily.  Dispense: 30 g; Refill: 5     Return in about 1 year (around 05/31/2024) for physical.   Ivonne Andrew, NP 06/01/2023

## 2023-06-02 LAB — CBC
Hematocrit: 38.2 % (ref 34.0–46.6)
Hemoglobin: 12.3 g/dL (ref 11.1–15.9)
MCH: 27.7 pg (ref 26.6–33.0)
MCHC: 32.2 g/dL (ref 31.5–35.7)
MCV: 86 fL (ref 79–97)
Platelets: 220 10*3/uL (ref 150–450)
RBC: 4.44 x10E6/uL (ref 3.77–5.28)
RDW: 13.1 % (ref 11.7–15.4)
WBC: 6.4 10*3/uL (ref 3.4–10.8)

## 2023-06-02 LAB — LIPID PANEL
Chol/HDL Ratio: 2.4 ratio (ref 0.0–4.4)
Cholesterol, Total: 131 mg/dL (ref 100–199)
HDL: 55 mg/dL (ref >39)
LDL Chol Calc (NIH): 62 mg/dL (ref 0–99)
Triglycerides: 71 mg/dL (ref 0–149)
VLDL Cholesterol Cal: 14 mg/dL (ref 5–40)

## 2023-06-02 LAB — COMPREHENSIVE METABOLIC PANEL
ALT: 5 IU/L (ref 0–32)
AST: 13 IU/L (ref 0–40)
Albumin: 4.1 g/dL (ref 3.9–4.9)
Alkaline Phosphatase: 84 IU/L (ref 44–121)
BUN/Creatinine Ratio: 14 (ref 9–23)
BUN: 8 mg/dL (ref 6–24)
Bilirubin Total: 0.4 mg/dL (ref 0.0–1.2)
CO2: 22 mmol/L (ref 20–29)
Calcium: 9 mg/dL (ref 8.7–10.2)
Chloride: 101 mmol/L (ref 96–106)
Creatinine, Ser: 0.58 mg/dL (ref 0.57–1.00)
Globulin, Total: 2.7 g/dL (ref 1.5–4.5)
Glucose: 94 mg/dL (ref 70–99)
Potassium: 3.9 mmol/L (ref 3.5–5.2)
Sodium: 137 mmol/L (ref 134–144)
Total Protein: 6.8 g/dL (ref 6.0–8.5)
eGFR: 111 mL/min/{1.73_m2} (ref >59)

## 2023-06-02 LAB — TSH: TSH: 0.707 u[IU]/mL (ref 0.450–4.500)

## 2023-08-21 ENCOUNTER — Ambulatory Visit: Payer: Medicaid Other | Attending: Cardiology | Admitting: Cardiology

## 2023-08-21 NOTE — Progress Notes (Unsigned)
  Cardiology Office Note:  .   Date:  08/21/2023  ID:  Kirsten Torres, DOB 17-Apr-1971, MRN [id number] PCP: Ivonne Andrew, NP  [facility] Providers Cardiologist:  Truett Mainland, MD PCP: Ivonne Andrew, NP  No chief complaint on file.     History of Present Illness: .    Kirsten Torres is a 52 y.o. female with ***  There were no vitals filed for this visit.   ROS: *** ROS   Studies Reviewed: .        *** Independently interpreted 05/2023: Chol 131, TG 71, HDL 55, LDL 62 Cr 0.5  Risk Assessment/Calculations:   {Does this patient have ATRIAL FIBRILLATION?:[phone]}     Physical Exam:   Physical Exam   VISIT DIAGNOSES: No diagnosis found.   ASSESSMENT AND PLAN: .    SUMI AX is a 52 y.o. female with ***  {Are you ordering a CV Procedure (e.g. stress test, cath, DCCV, TEE, etc)?   Press F2        :[id number]}    No orders of the defined types were placed in this encounter.    F/u in ***  Signed, Elder Negus, MD

## 2023-09-25 ENCOUNTER — Ambulatory Visit: Payer: Medicaid Other | Attending: Cardiology | Admitting: Cardiology

## 2023-09-25 VITALS — BP 122/64 | HR 81 | Ht 65.0 in | Wt 198.8 lb

## 2023-09-25 DIAGNOSIS — I499 Cardiac arrhythmia, unspecified: Secondary | ICD-10-CM

## 2023-09-25 DIAGNOSIS — R0609 Other forms of dyspnea: Secondary | ICD-10-CM

## 2023-09-25 DIAGNOSIS — Z8249 Family history of ischemic heart disease and other diseases of the circulatory system: Secondary | ICD-10-CM

## 2023-09-25 NOTE — Patient Instructions (Signed)
 Medication Instructions:   Your physician recommends that you continue on your current medications as directed. Please refer to the Current Medication list given to you today.  *If you need a refill on your cardiac medications before your next appointment, please call your pharmacy*   Lab Work:  TOMORROW HERE IN THIS BUILDING AT THE FIRST FLOOR [facility]--BMET, CBC W DIFF, PRO-BNP, AND LIPIDS--PLEASE GO FASTING TO THIS LAB APPOINTMENT  If you have labs (blood work) drawn today and your tests are completely normal, you will receive your results only by: MyChart Message (if you have MyChart) OR A paper copy in the mail If you have any lab test that is abnormal or we need to change your treatment, we will call you to review the results.   Testing/Procedures:  Your physician has requested that you have an echocardiogram. Echocardiography is a painless test that uses sound waves to create images of your heart. It provides your doctor with information about the size and shape of your heart and how well your heart's chambers and valves are working. This procedure takes approximately one hour. There are no restrictions for this procedure. Please do NOT wear cologne, perfume, aftershave, or lotions (deodorant is allowed). Please arrive 15 minutes prior to your appointment time.  Please note: We ask at that you not bring children with you during ultrasound (echo/ vascular) testing. Due to room size and safety concerns, children are not allowed in the ultrasound rooms during exams. Our front office staff cannot provide observation of children in our lobby area while testing is being conducted. An adult accompanying a patient to their appointment will only be allowed in the ultrasound room at the discretion of the ultrasound technician under special circumstances. We apologize for any inconvenience.    CARDIAC CALCIUM  SCORE (SELF PAY)    Follow-Up:  3 MONTHS WITH DR. PATWARDHAN OR AN EXTENDER

## 2023-09-25 NOTE — Progress Notes (Signed)
 Cardiology Office Note:  .   Date:  09/25/2023  ID:  Kirsten Torres, DOB [birth year], MRN [id number] PCP: Oley Bascom RAMAN, NP  [facility] Providers Cardiologist:  Newman Lawrence, MD PCP: Oley Bascom RAMAN, NP  Chief Complaint  Patient presents with   Irregular Heart Beat    Patient speaks Arabic, husband assisted with interpretation absence of language interpreter.  History of Present Illness: .    Kirsten Torres is a 53 y.o. female with family history of congestive heart failure, coronary artery disease, referred for a regular heartbeat.    Patient denies any complaints of chest pain, has occasional exertional dyspnea and leg edema.  She denies any palpitations.  She was found to have irregular heartbeat on recent PCP visit.  Patient has multiple family members with congestive heart failure as well as coronary artery disease.    Vitals:   09/25/23 1618  BP: 122/64  Pulse: 81  SpO2: 98%      ROS:  Review of Systems  Cardiovascular:  Positive for dyspnea on exertion and leg swelling. Negative for chest pain, palpitations and syncope.     Studies Reviewed: SABRA        EKG 09/25/2023:  Sinus rhythm 81 bpm Low voltage No previous ECGs available    Independently interpreted 05/2023: Chol 131, TG 71, HDL 55, LDL 62 Hb 12.3 Cr 9.41 TSH 0.7    Physical Exam:   Physical Exam Vitals and nursing note reviewed.  Constitutional:      General: She is not in acute distress. Neck:     Vascular: No JVD.  Cardiovascular:     Rate and Rhythm: Normal rate and regular rhythm.     Heart sounds: Normal heart sounds. No murmur heard. Pulmonary:     Effort: Pulmonary effort is normal.     Breath sounds: Normal breath sounds. No wheezing or rales.  Musculoskeletal:     Right lower leg: No edema.     Left lower leg: No edema.      VISIT DIAGNOSES:   ICD-10-CM   1. Irregular heart beat  I49.9 EKG 12-Lead    Basic metabolic panel    CBC w/Diff    Lipid  Profile    Pro b natriuretic peptide (BNP)    ECHOCARDIOGRAM COMPLETE    CT CARDIAC SCORING (SELF PAY ONLY)    Pro b natriuretic peptide (BNP)    Lipid Profile    CBC w/Diff    Basic metabolic panel    2. Exertional dyspnea  R06.09 Basic metabolic panel    CBC w/Diff    Lipid Profile    Pro b natriuretic peptide (BNP)    ECHOCARDIOGRAM COMPLETE    CT CARDIAC SCORING (SELF PAY ONLY)    Pro b natriuretic peptide (BNP)    Lipid Profile    CBC w/Diff    Basic metabolic panel    3. Family history of early CAD  Z8.49 Basic metabolic panel    CBC w/Diff    Lipid Profile    Pro b natriuretic peptide (BNP)    ECHOCARDIOGRAM COMPLETE    CT CARDIAC SCORING (SELF PAY ONLY)    Pro b natriuretic peptide (BNP)    Lipid Profile    CBC w/Diff    Basic metabolic panel       ASSESSMENT AND PLAN: .    Kirsten Torres is a 53 y.o. female with  family history of congestive heart failure, coronary artery disease, referred for a  regular heartbeat.    Normal physical exam and EKG.  Given family history, PCP reported irregular heartbeat, recommend to examine her, echocardiogram, calcium  score scan.  Further recommendations after above testing     F/u in 3 months  Signed, Newman JINNY Lawrence, MD

## 2023-09-26 ENCOUNTER — Encounter: Payer: Self-pay | Admitting: Cardiology

## 2023-09-26 DIAGNOSIS — Z8249 Family history of ischemic heart disease and other diseases of the circulatory system: Secondary | ICD-10-CM | POA: Insufficient documentation

## 2023-09-26 DIAGNOSIS — R0609 Other forms of dyspnea: Secondary | ICD-10-CM | POA: Insufficient documentation

## 2023-09-29 DIAGNOSIS — R0609 Other forms of dyspnea: Secondary | ICD-10-CM | POA: Diagnosis not present

## 2023-09-29 DIAGNOSIS — I499 Cardiac arrhythmia, unspecified: Secondary | ICD-10-CM | POA: Diagnosis not present

## 2023-09-29 DIAGNOSIS — Z8249 Family history of ischemic heart disease and other diseases of the circulatory system: Secondary | ICD-10-CM | POA: Diagnosis not present

## 2023-09-29 LAB — CBC WITH DIFFERENTIAL/PLATELET

## 2023-09-30 LAB — BASIC METABOLIC PANEL
BUN/Creatinine Ratio: 16 (ref 9–23)
BUN: 11 mg/dL (ref 6–24)
CO2: 25 mmol/L (ref 20–29)
Calcium: 9 mg/dL (ref 8.7–10.2)
Chloride: 105 mmol/L (ref 96–106)
Creatinine, Ser: 0.68 mg/dL (ref 0.57–1.00)
Glucose: 98 mg/dL (ref 70–99)
Potassium: 4.1 mmol/L (ref 3.5–5.2)
Sodium: 140 mmol/L (ref 134–144)
eGFR: 106 mL/min/{1.73_m2} (ref >59)

## 2023-09-30 LAB — CBC WITH DIFFERENTIAL/PLATELET
Basos: 1 %
EOS (ABSOLUTE): 0 10*3/uL (ref 0.0–0.2)
Eos: 1 %
Hematocrit: 39.6 % (ref 34.0–46.6)
Hemoglobin: 12.7 g/dL (ref 11.1–15.9)
Immature Granulocytes: 0 %
Immature Granulocytes: 0 10*3/uL (ref 0.0–0.1)
Lymphs: 45 %
MCH: 27.6 pg (ref 26.6–33.0)
MCHC: 32.1 g/dL (ref 31.5–35.7)
MCV: 86 fL (ref 79–97)
Monocytes Absolute: 0 10*3/uL (ref 0.0–0.4)
Monocytes Absolute: 0.3 10*3/uL (ref 0.1–0.9)
Monocytes: 7 %
Neutrophils Absolute: 2.1 10*3/uL (ref 0.7–3.1)
Neutrophils Absolute: 2.2 10*3/uL (ref 1.4–7.0)
Neutrophils: 46 %
Platelets: 255 10*3/uL (ref 150–450)
RBC: 4.6 x10E6/uL (ref 3.77–5.28)
RDW: 12.4 % (ref 11.7–15.4)
WBC: 4.7 10*3/uL (ref 3.4–10.8)

## 2023-09-30 LAB — LIPID PANEL
Chol/HDL Ratio: 2.9 {ratio} (ref 0.0–4.4)
Cholesterol, Total: 143 mg/dL (ref 100–199)
HDL: 50 mg/dL (ref >39)
LDL Chol Calc (NIH): 78 mg/dL (ref 0–99)
Triglycerides: 76 mg/dL (ref 0–149)
VLDL Cholesterol Cal: 15 mg/dL (ref 5–40)

## 2023-09-30 LAB — PRO B NATRIURETIC PEPTIDE: NT-Pro BNP: <36 pg/mL (ref 0–249)

## 2023-10-06 ENCOUNTER — Other Ambulatory Visit (HOSPITAL_COMMUNITY): Payer: Medicaid Other

## 2023-10-10 ENCOUNTER — Encounter: Payer: Self-pay | Admitting: *Deleted

## 2023-10-10 ENCOUNTER — Ambulatory Visit (HOSPITAL_COMMUNITY)
Admission: RE | Admit: 2023-10-10 | Discharge: 2023-10-10 | Disposition: A | Discharge status: Home / Self Care | Payer: Self-pay | Source: Ambulatory Visit | Attending: Cardiology | Admitting: Cardiology

## 2023-10-10 DIAGNOSIS — R0609 Other forms of dyspnea: Secondary | ICD-10-CM | POA: Insufficient documentation

## 2023-10-10 DIAGNOSIS — I499 Cardiac arrhythmia, unspecified: Secondary | ICD-10-CM | POA: Insufficient documentation

## 2023-10-10 DIAGNOSIS — Z8249 Family history of ischemic heart disease and other diseases of the circulatory system: Secondary | ICD-10-CM | POA: Insufficient documentation

## 2023-10-11 NOTE — Progress Notes (Signed)
May need Arabic language interpretation. Good news, no calcium noted in heart arteries.  Thanks MJP

## 2023-10-16 ENCOUNTER — Ambulatory Visit (HOSPITAL_COMMUNITY): Payer: Medicaid Other | Attending: Cardiology

## 2023-10-16 DIAGNOSIS — R0609 Other forms of dyspnea: Secondary | ICD-10-CM | POA: Diagnosis not present

## 2023-10-16 DIAGNOSIS — Z8249 Family history of ischemic heart disease and other diseases of the circulatory system: Secondary | ICD-10-CM | POA: Insufficient documentation

## 2023-10-16 DIAGNOSIS — I499 Cardiac arrhythmia, unspecified: Secondary | ICD-10-CM | POA: Diagnosis not present

## 2023-10-17 ENCOUNTER — Encounter: Payer: Self-pay | Admitting: Cardiology

## 2023-10-17 LAB — ECHOCARDIOGRAM COMPLETE
Area-P 1/2: 4.06 cm2
S' Lateral: 2.5 cm

## 2023-10-26 ENCOUNTER — Encounter: Payer: Self-pay | Admitting: *Deleted

## 2023-12-18 ENCOUNTER — Encounter: Payer: Self-pay | Admitting: Cardiology

## 2023-12-18 ENCOUNTER — Ambulatory Visit: Payer: Medicaid Other | Attending: Cardiology | Admitting: Cardiology

## 2023-12-18 VITALS — BP 124/80 | HR 83 | Resp 16 | Ht 65.0 in | Wt 197.6 lb

## 2023-12-18 DIAGNOSIS — R0609 Other forms of dyspnea: Secondary | ICD-10-CM | POA: Diagnosis not present

## 2023-12-18 NOTE — Progress Notes (Signed)
  Cardiology Office Note:  .   Date:  12/18/2023  ID:  Kirsten Torres, DOB [birth year], MRN [id number] PCP: Ivonne Andrew, NP  [facility] Providers Cardiologist:  Truett Mainland, MD PCP: Ivonne Andrew, NP  Chief Complaint  Patient presents with   Irregular Heart Beat   Follow-up    Patient speaks Arabic, husband assisted with interpretation absence of language interpreter.  History of Present Illness: .    Kirsten Torres is a 53 y.o. female with exertional dyspnea  Language interpreter services were sued for today's encounter  Reviewed recent test results with the patient, details below.    Vitals:   12/18/23 1547  BP: 124/80  Pulse: 83  Resp: 16  SpO2: 98%       ROS:  Review of Systems  Cardiovascular:  Positive for dyspnea on exertion. Negative for chest pain, leg swelling, palpitations and syncope.     Studies Reviewed: Marland Kitchen        EKG 09/25/2023:  Sinus rhythm 81 bpm Low voltage No previous ECGs available    Independently interpreted 09/2023: Chol 143, TG 76, HDL 50, LDL 78 Hb 12.7 Cr 3.87 TSH 0.7  05/2023: Chol 131, TG 71, HDL 55, LDL 62 Hb 12.3 Cr 5.64 TSH 0.7  Independently interpreted Echocardiogram 10/2023: EF 60 to 65%. Essentially normal echocardiogram  CT cardiac scoring 09/2023: Calcium score 0 No acute extracardiac findings  Physical Exam:   Physical Exam Vitals and nursing note reviewed.  Constitutional:      General: She is not in acute distress. Neck:     Vascular: No JVD.  Cardiovascular:     Rate and Rhythm: Normal rate and regular rhythm.     Heart sounds: Normal heart sounds. No murmur heard. Pulmonary:     Effort: Pulmonary effort is normal.     Breath sounds: Normal breath sounds. No wheezing or rales.  Musculoskeletal:     Right lower leg: No edema.     Left lower leg: No edema.      VISIT DIAGNOSES:   ICD-10-CM   1. Exertional dyspnea  R06.09         ASSESSMENT AND PLAN: .    Kirsten Torres is a 53 y.o. female with  family history of congestive heart failure, coronary artery disease, referred for a regular heartbeat.    Normal physical exam and EKG. Reassuring echocardiogram and exercise treadmill stress test. Calcium score 0. I suspect symptoms are noncardiac in origin.   F/u as needed  Signed, Elder Negus, MD

## 2023-12-18 NOTE — Patient Instructions (Signed)
  Follow-Up: At [facility], you and your health needs are our priority.  As part of our continuing mission to provide you with exceptional heart care, our providers are all part of one team.  This team includes your primary Cardiologist (physician) and Advanced Practice Providers or APPs (Physician Assistants and Nurse Practitioners) who all work together to provide you with the care you need, when you need it.  Your next appointment:   AS NEEDED  Provider:   Elder Negus, MD     We recommend signing up for the patient portal called "MyChart".  Sign up information is provided on this After Visit Summary.  MyChart is used to connect with patients for Virtual Visits (Telemedicine).  Patients are able to view lab/test results, encounter notes, upcoming appointments, etc.  Non-urgent messages can be sent to your provider as well.   To learn more about what you can do with MyChart, go to [email or web address].   Other Instructions       1st Floor: - Lobby - Registration  - Pharmacy  - Lab - Cafe  2nd Floor: - PV Lab - Diagnostic Testing (echo, CT, nuclear med)  3rd Floor: - Vacant  4th Floor: - TCTS (cardiothoracic surgery) - AFib Clinic - Structural Heart Clinic - Vascular Surgery  - Vascular Ultrasound  5th Floor: - HeartCare Cardiology (general and EP) - Clinical Pharmacy for coumadin, hypertension, lipid, weight-loss medications, and med management appointments    Valet parking services will be available as well.

## 2024-05-31 ENCOUNTER — Ambulatory Visit: Payer: Self-pay | Admitting: Nurse Practitioner

## 2024-06-13 ENCOUNTER — Ambulatory Visit: Payer: Self-pay | Admitting: Nurse Practitioner

## 2024-07-01 ENCOUNTER — Encounter: Payer: Self-pay | Admitting: Nurse Practitioner

## 2024-07-01 ENCOUNTER — Ambulatory Visit (INDEPENDENT_AMBULATORY_CARE_PROVIDER_SITE_OTHER): Payer: Self-pay | Admitting: Nurse Practitioner

## 2024-07-01 VITALS — BP 116/82 | HR 86 | Wt 198.0 lb

## 2024-07-01 DIAGNOSIS — Z0000 Encounter for general adult medical examination without abnormal findings: Secondary | ICD-10-CM

## 2024-07-01 DIAGNOSIS — Z13220 Encounter for screening for lipoid disorders: Secondary | ICD-10-CM

## 2024-07-01 DIAGNOSIS — K2100 Gastro-esophageal reflux disease with esophagitis, without bleeding: Secondary | ICD-10-CM

## 2024-07-01 MED ORDER — EVENING PRIMROSE OIL-CRANBERRY 500-200 MG PO CAPS: 1.0000 | ORAL_CAPSULE | Freq: Every day | ORAL | 2 refills | Status: AC

## 2024-07-01 MED ORDER — LACTOBACILLUS PROBIOTIC PO TABS: 1.0000 | ORAL_TABLET | Freq: Every day | ORAL | 2 refills | Status: AC

## 2024-07-01 MED ORDER — OMEPRAZOLE 20 MG PO CPDR: 20.0000 mg | DELAYED_RELEASE_CAPSULE | Freq: Every day | ORAL | 3 refills | Status: AC

## 2024-07-01 NOTE — Progress Notes (Signed)
 Subjective   Patient ID: Kirsten Torres, female    DOB: October 05, 1970, 53 y.o.   MRN: [id number]  Chief Complaint  Patient presents with   Annual Exam    Referring provider: Oley Bascom RAMAN, NP  Kirsten Torres is a 53 y.o. female with Past Medical History: No date: Family history of heart disease No date: Genital mutilation, female No date: GERD (gastroesophageal reflux disease) No date: Irregular heart rate 05/28/2011: SVD (spontaneous vaginal delivery)   HPI  Patient presents today for a physical.  Overall she has been doing well since her last visit here.  She states that she has started on some supplements.  She would like refills sent to the pharmacy for lactobacillus and primrose oil. Denies f/c/s, n/v/d, hemoptysis, PND, leg swelling. Denies chest pain or edema.      Allergies  Allergen Reactions   Iron     Had reaction to iron infusion     Immunization History  Administered Date(s) Administered   Tdap 05/29/2011    Tobacco History: Social History   Tobacco Use  Smoking Status Never  Smokeless Tobacco Never   Counseling given: Not Answered   Outpatient Encounter Medications as of 07/01/2024  Medication Sig   Evening Primrose Oil-Cranberry 500-200 MG CAPS Take 1 capsule by mouth daily.   hydrocortisone  (PROCTOSOL HC) 2.5 % rectal cream Place 1 Application rectally 2 (two) times daily.   Lactobacillus Probiotic TABS Take 1 capsule by mouth daily.   Naproxen  Sodium (ALEVE  PO) Take by mouth.   OVER THE COUNTER MEDICATION    Probiotic Product (PROBIOTIC PO) Take 60 mg by mouth.   [DISCONTINUED] omeprazole  (PRILOSEC) 20 MG capsule Take 1 capsule (20 mg total) by mouth daily. Will need to schedule appointment for refills   linaclotide  (LINZESS ) 145 MCG CAPS capsule Take 1 capsule (145 mcg total) by mouth daily before breakfast. (Patient not taking: Reported on 07/01/2024)   omeprazole  (PRILOSEC) 20 MG capsule Take 1 capsule (20 mg total) by mouth daily.  Will need to schedule appointment for refills   No facility-administered encounter medications on file as of 07/01/2024.    Review of Systems  Review of Systems  Constitutional: Negative.   HENT: Negative.    Cardiovascular: Negative.   Gastrointestinal: Negative.   Allergic/Immunologic: Negative.   Neurological: Negative.   Psychiatric/Behavioral: Negative.       Objective:   BP 116/82   Pulse 86   Wt 198 lb (89.8 kg)   SpO2 100%   BMI 32.95 kg/m   Wt Readings from Last 5 Encounters:  07/01/24 198 lb (89.8 kg)  12/18/23 197 lb 9.6 oz (89.6 kg)  09/25/23 198 lb 12.8 oz (90.2 kg)  06/01/23 202 lb 3.2 oz (91.7 kg)  07/01/21 191 lb 0.2 oz (86.6 kg)     Physical Exam Vitals and nursing note reviewed.  Constitutional:      General: She is not in acute distress.    Appearance: She is well-developed.  Cardiovascular:     Rate and Rhythm: Normal rate and regular rhythm.  Pulmonary:     Effort: Pulmonary effort is normal.     Breath sounds: Normal breath sounds.  Neurological:     Mental Status: She is alert and oriented to person, place, and time.       Assessment & Plan:   Routine adult health maintenance -     CBC -     Comprehensive metabolic panel with GFR  Gastroesophageal reflux disease with esophagitis  without hemorrhage -     Omeprazole ; Take 1 capsule (20 mg total) by mouth daily. Will need to schedule appointment for refills  Dispense: 90 capsule; Refill: 3  Lipid screening -     Lipid panel  Other orders -     Evening Primrose Oil-Cranberry; Take 1 capsule by mouth daily.  Dispense: 30 capsule; Refill: 2 -     Lactobacillus Probiotic; Take 1 capsule by mouth daily.  Dispense: 30 tablet; Refill: 2     Return in about 1 year (around 07/01/2025) for Physical.   Bascom GORMAN Borer, NP 07/01/2024

## 2024-07-02 LAB — LIPID PANEL
Chol/HDL Ratio: 2.7 ratio (ref 0.0–4.4)
Cholesterol, Total: 140 mg/dL (ref 100–199)
HDL: 52 mg/dL (ref >39)
LDL Chol Calc (NIH): 74 mg/dL (ref 0–99)
Triglycerides: 69 mg/dL (ref 0–149)
VLDL Cholesterol Cal: 14 mg/dL (ref 5–40)

## 2024-07-02 LAB — COMPREHENSIVE METABOLIC PANEL WITH GFR
ALT: 7 IU/L (ref 0–32)
AST: 16 IU/L (ref 0–40)
Albumin: 4.2 g/dL (ref 3.8–4.9)
Alkaline Phosphatase: 99 IU/L (ref 49–135)
BUN/Creatinine Ratio: 14 (ref 9–23)
BUN: 10 mg/dL (ref 6–24)
Bilirubin Total: 0.4 mg/dL (ref 0.0–1.2)
CO2: 26 mmol/L (ref 20–29)
Calcium: 9.6 mg/dL (ref 8.7–10.2)
Chloride: 102 mmol/L (ref 96–106)
Creatinine, Ser: 0.72 mg/dL (ref 0.57–1.00)
Globulin, Total: 3 g/dL (ref 1.5–4.5)
Glucose: 97 mg/dL (ref 70–99)
Potassium: 3.9 mmol/L (ref 3.5–5.2)
Sodium: 140 mmol/L (ref 134–144)
Total Protein: 7.2 g/dL (ref 6.0–8.5)
eGFR: 101 mL/min/1.73 (ref >59)

## 2024-07-02 LAB — CBC
Hematocrit: 41.5 % (ref 34.0–46.6)
Hemoglobin: 13.2 g/dL (ref 11.1–15.9)
MCH: 27.7 pg (ref 26.6–33.0)
MCHC: 31.8 g/dL (ref 31.5–35.7)
MCV: 87 fL (ref 79–97)
Platelets: 259 x10E3/uL (ref 150–450)
RBC: 4.77 x10E6/uL (ref 3.77–5.28)
RDW: 12.2 % (ref 11.7–15.4)
WBC: 5.3 x10E3/uL (ref 3.4–10.8)

## 2024-07-03 ENCOUNTER — Ambulatory Visit: Payer: Self-pay | Admitting: Nurse Practitioner

## 2025-07-07 ENCOUNTER — Encounter: Payer: Self-pay | Admitting: Nurse Practitioner
